# Patient Record
Sex: Female | Born: 1935 | Race: White | Hispanic: No
Health system: Midwestern US, Community
[De-identification: ages and names within clinical notes are randomized; demographics above are authoritative.]

## PROBLEM LIST (undated history)

## (undated) DIAGNOSIS — N83209 Unspecified ovarian cyst, unspecified side: Secondary | ICD-10-CM

## (undated) DIAGNOSIS — W19XXXA Unspecified fall, initial encounter: Secondary | ICD-10-CM

## (undated) DIAGNOSIS — F32A Depression, unspecified: Secondary | ICD-10-CM

## (undated) DIAGNOSIS — F329 Major depressive disorder, single episode, unspecified: Secondary | ICD-10-CM

## (undated) DIAGNOSIS — F0390 Unspecified dementia without behavioral disturbance: Secondary | ICD-10-CM

## (undated) DIAGNOSIS — F419 Anxiety disorder, unspecified: Secondary | ICD-10-CM

## (undated) HISTORY — DX: Major depressive disorder, single episode, unspecified: F32.9

## (undated) HISTORY — DX: Anxiety disorder, unspecified: F41.9

## (undated) HISTORY — DX: Depression, unspecified: F32.A

---
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
## 2014-01-17 NOTE — Progress Notes (Signed)
Chief Complaint   Patient presents with   ??? Ovarian Cyst     Visit Vitals   Item Reading   ??? BP 160/86   ??? Pulse 93   ??? Temp(Src) 98.2 ??F (36.8 ??C) (Oral)   ??? Resp 12   ??? Ht 5\' 3"  (1.6 m)   ??? Wt 132 lb 12.8 oz (60.238 kg)   ??? BMI 23.53 kg/m2     Patient states she wants a second opinion for the results from a lab test.  Patient states growth on ovary has increased in size.

## 2014-01-17 NOTE — Addendum Note (Signed)
Addended by: Therese SarahMILLER, Kynedi Profitt A on: 01/17/2014 12:29 PM     Modules accepted: Orders

## 2014-01-17 NOTE — Progress Notes (Signed)
HPI  Sierra Walker is a 80 y.o. female who presents to discuss ultrasound results. She had a right lower quadrant ultrasound and 1/15 that showed a simple cyst 2.1x2.8x4.1cm on her right ovary. It had increased in size from a previous ultrasound" years ago". She is experiencing some intermittent right lower quadrant pain. She presents to discuss her options. She is concerned about cancer and the fact she is having the occasional RLQ pain.    Ultrasound was done at [facility].    She is currently being treated for a E. coli UTI that is resistant to cephalosporins and Macrobid.    PMHx:  No past medical history on file.    Meds:   Current Outpatient Prescriptions   Medication Sig Dispense Refill   ??? solifenacin (VESICARE) 5 mg tablet Take 5 mg by mouth daily.       ??? mometasone (NASONEX) 50 mcg/actuation nasal spray 2 Sprays daily.       ??? PSEUDOEPHEDRINE-guaiFENesin (MUCINEX D) 60-600 mg per tablet Take 1 Tab by mouth every twelve (12) hours.           Allergies:   Allergies   Allergen Reactions   ??? Sulfa (Sulfonamide Antibiotics) Rash       Smoker:  History   Smoking status   ??? Not on file   Smokeless tobacco   ??? Not on file       ETOH:   History   Alcohol Use: Not on file       FH: No family history on file.    ROS:  As listed in HPI. In addition:  Constitutional:   No headache, fever, fatigue, weight loss or weight gain      Eyes:   No redness, pruritis, pain, visual changes, swelling, or discharge      Ears:    No pain, loss or changes in hearing     Cardiac:    No chest pain      Resp:   No cough or shortness of breath      Neuro   No loss of consciousness, dizziness, seizures      Physical Exam:  BP 160/86    Pulse 93    Temp(Src) 98.2 ??F (36.8 ??C) (Oral)    Resp 12    Ht 5\' 3"  (1.6 m)    Wt 132 lb 12.8 oz (60.238 kg)    BMI 23.53 kg/m2     GEN: No apparent distress. Alert and oriented and responds to all questions appropriately.           LUNGS: Respirations unlabored; clear to auscultation  bilaterally  CARDIOVASCULAR: Regular, rate, and rhythm without murmurs, gallops or rubs   ABDOMEN: Soft; nontender; nondistended; normoactive bowel sounds  NEUROLOGIC:  No focal neurologic deficits. Strength and sensation grossly intact.  Coordination and gait grossly intact.        Assessment and Plan     Right ovarian mass a postmenopausal woman  Discussed options including watchfull waiting   patient would prefer surgery. Will refer her to gyn/onc      ICD-9-CM   1. Right ovarian cyst 620.2     Attending note:      Patient interviewed and examined.  Discussion on ovarian pathology, reviewed possible treatments, recommended referral to GynOnc for evaluation and management  Resident note reviewed and agree without amendment.    Sierra Walker was seen today for ovarian cyst.    Diagnoses and associated orders for this visit:  Right ovarian cyst  - OVARIAN MALIGNANCY RISK-ROMA        Follow-up Disposition:  Return after referral.            Bing Neighbors. 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

## 2014-01-19 LAB — PREMENOPAUSAL INTERP: HIGH

## 2014-01-19 LAB — OVARIAN MALIGNANCY RISK-ROMA
CANCER ANTIGEN 125 (CA125): 12.5 U/mL (ref 0.0–35.0)
HE4: 88 pmol/L (ref 0–150)
POSTMENOPAUSAL ROMA: 1.7
PREMENOPAUSAL ROMA: 2.34 — ABNORMAL HIGH

## 2014-01-19 LAB — POSTMENOPAUSAL INTERP: LOW

## 2014-01-22 NOTE — Progress Notes (Signed)
[facility]  [address], [address]  [place], [place] [address]  [phone] FAX [phone]  Kathe Marinerharles M. Jones III, MD   Annia FriendlyJohnny Hyde, MD  Office Note  Patient ID:  Name:  Sierra Walker  MRN:  [id number]  DOB:  01-Nov-1927/80 y.o.  Date:  01/22/2014      HISTORY OF PRESENT ILLNESS:  Sierra Walker is a 80 y.o. Caucasian postmenopausal female who is being seen for a postmenopausal cyst on her right ovary.  It appears to have grown over a period of time, but appears simple.  Her ROMA is negative.  The cyst measures 4.1 x 2.8 x 2.1.  There is nor free fluid.  She does report some occasional pain in there RLQ.  She is quite anxious about the mass and wishes to have it surgically removed.  She had a prior vaginal hysterectomy for benign indications.      ROS:  GU and GI review: Negative  Cardiopulmonary review:Negative   Musculoskeletal:  Negative    A comprehensive review of systems was negative except for that written in the History of Present Illness. , 10 point ROS    OB/GYN ROS:  There is no history of significant gyn problems or procedures except that mentioned above.  Patient denies any abnormal bleeding or vaginal discharge.         Problem List:  Patient Active Problem List    Diagnosis Date Noted   ??? Right ovarian cyst 01/17/2014     PMH:  History reviewed. No pertinent past medical history.   PSH:  Past Surgical History   Procedure Laterality Date   ??? Hx gyn       ovaries removed age 80   ??? Hx colonoscopy     ??? Hx gi       e coli      Social History:  History   Substance Use Topics   ??? Smoking status: Never Smoker    ??? Smokeless tobacco: Never Used   ??? Alcohol Use: No      Family History:  Family History   Problem Relation Age of Onset   ??? No Known Problems Mother    ??? No Known Problems Father    ??? Cancer Brother      melanoma      Medications: (reviewed)  Current Outpatient Prescriptions   Medication Sig   ??? solifenacin (VESICARE) 5 mg tablet Take 5 mg by mouth daily.   ??? mometasone (NASONEX)  50 mcg/actuation nasal spray 2 Sprays daily.   ??? PSEUDOEPHEDRINE-guaiFENesin (MUCINEX D) 60-600 mg per tablet Take 1 Tab by mouth every twelve (12) hours.   ??? ciprofloxacin HCl (CIPRO) 500 mg tablet Take 200 mg by mouth two (2) times a day.   ??? levofloxacin (LEVAQUIN) 500 mg tablet    ??? tetracycline (ACHROMYCIN, SUMYCIN) 500 mg capsule      No current facility-administered medications for this visit.     Allergies: (reviewed)  Allergies   Allergen Reactions   ??? Sulfa (Sulfonamide Antibiotics) Rash          OBJECTIVE:    Physical Exam:  VITAL SIGNS: Filed Vitals:    01/22/14 1353   BP: 156/82   Pulse: 89   Height: 5\' 3"  (1.6 m)   Weight: 133 lb 3.2 oz (60.419 kg)     Body mass index is 23.6 kg/(m^2).   GENERAL APP: Conversant, alert, oriented. No acute distress.   HEENT: HEENT. No thyroid enlargement.  No JVD.   Neck: Supple without restrictions.   RESPIRATORY: Clear to auscultation and percussion to the bases. No CVAT.   CARDIOVASC: RRR without murmur/rub.   GASTROINT: soft, non-tender, without masses or organomegaly   MUSCULOSKEL: no joint tenderness, deformity or swelling   EXTREMITIES: extremities normal, atraumatic, no cyanosis or edema   PELVIC: External genitalia: normal general appearance  Urinary system: urethral meatus normal  Vaginal: normal without tenderness, induration or masses  Cervix: absent  Adnexa: non palpable  Uterus: absent   RECTAL: Deferred   NODAL SURVEY: No suspicious lymphadenopathy or edema noted.   NEURO: Grossly intact. No acute deficit.       Imaging:  Outside pelvic ultrasound results reviewed.      IMPRESSION/PLAN:  Sierra Walker is a 80 y.o. female with a working diagnosis of postmenopausal cystic mass.  I reviewed with Sierra Walker her medical records, physical exam, and review of symptoms.  I reassured her that it was most likely a benign mass, but I understand her concern and agree to proceed with laparoscopic BSO.  She is counseled on the risks, benefits, indications, and  alternatives of surgery.  Her questions were answered and she wishes to proceed as planned.      Signed By: Domenica FailJohnny Hyde Jr., MD     01/22/2014/1:58 PM

## 2014-01-22 NOTE — Progress Notes (Signed)
Quick Note:    No additional comment  ______

## 2014-02-08 LAB — TYPE AND SCREEN
ABO/Rh: A POS
Antibody Screen: NEGATIVE

## 2014-02-08 LAB — EKG, 12 LEAD, INITIAL
Atrial Rate: 75 {beats}/min
Calculated P Axis: 51 degrees
Calculated R Axis: -39 degrees
Calculated T Axis: 28 degrees
P-R Interval: 162 ms
Q-T Interval: 398 ms
QRS Duration: 76 ms
QTC Calculation (Bezet): 444 ms
Ventricular Rate: 75 {beats}/min

## 2014-02-08 LAB — TYPE & SCREEN
ABO/Rh(D): A POS
Antibody screen: NEGATIVE

## 2014-02-08 NOTE — Other (Addendum)
PATIENT GIVEN SURGICAL SITE INFECTION FAQS INFORMATION HANDOUT.  PT HAS BEEN GIVEN THE OPPORTUNITY TO ASK ADDITIONAL QUESTIONS.    PATIENT DENIES TRAVEL TO [place] OR ANY AREA WHERE EVD TRANSMISSION HAS BEEN REPORTED BY WHO; AND DENIES CLOSE CONTACT WITH ANYONE TRAVELING TO THOSE AREAS IN THE PAST 21 DAYS.    DR. Berenice PrimasHYDE'S OFFICE AWARE THAT PT MAY NOT HAVE ANYONE TO STAY WITH HER FIRST 24 HRS AFTER SURGERY; SHE MAY NEED TO BE A 23HR OBSERVATION.  PT WILL WORK ON FINDING SOMEONE TO STAY WITH HER, BUT WILL LET DR. HYDE KNOW DOS.    PT GIVEN DIRECTIONS TO HAVE CXR COMPLETED

## 2014-02-09 NOTE — Other (Signed)
FOLLOW UP:    ALL PAT TEST RESULTS FAXED TO DR. Berenice PrimasHYDE'S OFFICE; [facility].  DR. Lendell CapriceBRANDON-ANESTHESIA OK'D EKG FOR SURGERY.

## 2014-02-12 ENCOUNTER — Inpatient Hospital Stay: Payer: MEDICARE

## 2014-02-12 MED ORDER — MIDAZOLAM 1 MG/ML IJ SOLN
1 mg/mL | INTRAMUSCULAR | Status: DC | PRN
Start: 2014-02-12 — End: 2014-02-12

## 2014-02-12 MED ORDER — ROCURONIUM 10 MG/ML IV
10 mg/mL | INTRAVENOUS | Status: DC | PRN
Start: 2014-02-12 — End: 2014-02-12
  Administered 2014-02-12: 12:00:00 via INTRAVENOUS

## 2014-02-12 MED ORDER — PROPOFOL 10 MG/ML IV EMUL
10 mg/mL | INTRAVENOUS | Status: DC | PRN
Start: 2014-02-12 — End: 2014-02-12
  Administered 2014-02-12: 12:00:00 via INTRAVENOUS

## 2014-02-12 MED ORDER — SODIUM CHLORIDE 0.9 % IJ SYRG
INTRAMUSCULAR | Status: DC | PRN
Start: 2014-02-12 — End: 2014-02-12

## 2014-02-12 MED ORDER — FENTANYL CITRATE (PF) 50 MCG/ML IJ SOLN
50 mcg/mL | INTRAMUSCULAR | Status: AC
Start: 2014-02-12 — End: ?

## 2014-02-12 MED ORDER — CEFOXITIN 2 GRAM IV SOLUTION
2 gram | INTRAVENOUS | Status: DC | PRN
Start: 2014-02-12 — End: 2014-02-12
  Administered 2014-02-12: 12:00:00 via INTRAVENOUS

## 2014-02-12 MED ORDER — LIDOCAINE (PF) 20 MG/ML (2 %) IJ SOLN
20 mg/mL (2 %) | INTRAMUSCULAR | Status: DC | PRN
Start: 2014-02-12 — End: 2014-02-12
  Administered 2014-02-12: 12:00:00 via INTRAVENOUS

## 2014-02-12 MED ORDER — FENTANYL CITRATE (PF) 50 MCG/ML IJ SOLN
50 mcg/mL | INTRAMUSCULAR | Status: DC | PRN
Start: 2014-02-12 — End: 2014-02-12
  Administered 2014-02-12 (×3): via INTRAVENOUS

## 2014-02-12 MED ORDER — DEXAMETHASONE SODIUM PHOSPHATE 4 MG/ML IJ SOLN
4 mg/mL | INTRAMUSCULAR | Status: AC
Start: 2014-02-12 — End: ?

## 2014-02-12 MED ORDER — DEXAMETHASONE SODIUM PHOSPHATE 4 MG/ML IJ SOLN
4 mg/mL | Freq: Once | INTRAMUSCULAR | Status: DC | PRN
Start: 2014-02-12 — End: 2014-02-12

## 2014-02-12 MED ORDER — SUCCINYLCHOLINE CHLORIDE 20 MG/ML INJECTION
20 mg/mL | INTRAMUSCULAR | Status: DC | PRN
Start: 2014-02-12 — End: 2014-02-12
  Administered 2014-02-12: 12:00:00 via INTRAVENOUS

## 2014-02-12 MED ORDER — FENTANYL CITRATE (PF) 50 MCG/ML IJ SOLN
50 mcg/mL | INTRAMUSCULAR | Status: DC | PRN
Start: 2014-02-12 — End: 2014-02-12
  Administered 2014-02-12: 12:00:00 via INTRAVENOUS

## 2014-02-12 MED ORDER — MORPHINE 10 MG/ML INJ SOLUTION
10 mg/ml | INTRAMUSCULAR | Status: DC | PRN
Start: 2014-02-12 — End: 2014-02-12

## 2014-02-12 MED ORDER — DIPHENHYDRAMINE HCL 50 MG/ML IJ SOLN
50 mg/mL | INTRAMUSCULAR | Status: DC | PRN
Start: 2014-02-12 — End: 2014-02-12

## 2014-02-12 MED ORDER — OXYCODONE-ACETAMINOPHEN 5 MG-325 MG TAB
5-325 mg | ORAL | Status: DC | PRN
Start: 2014-02-12 — End: 2014-02-12
  Administered 2014-02-12: 15:00:00 via ORAL

## 2014-02-12 MED ORDER — HYDROMORPHONE (PF) 2 MG/ML IJ SOLN
2 mg/mL | INTRAMUSCULAR | Status: AC
Start: 2014-02-12 — End: ?

## 2014-02-12 MED ORDER — ONDANSETRON (PF) 4 MG/2 ML INJECTION
4 mg/2 mL | INTRAMUSCULAR | Status: DC | PRN
Start: 2014-02-12 — End: 2014-02-12
  Administered 2014-02-12: 13:00:00 via INTRAVENOUS

## 2014-02-12 MED ORDER — LACTATED RINGERS IV
INTRAVENOUS | Status: DC
Start: 2014-02-12 — End: 2014-02-12

## 2014-02-12 MED ORDER — LACTATED RINGERS IV
INTRAVENOUS | Status: DC
Start: 2014-02-12 — End: 2014-02-12
  Administered 2014-02-12: 11:00:00 via INTRAVENOUS

## 2014-02-12 MED ORDER — LIDOCAINE (PF) 10 MG/ML (1 %) IJ SOLN
10 mg/mL (1 %) | INTRAMUSCULAR | Status: DC | PRN
Start: 2014-02-12 — End: 2014-02-12

## 2014-02-12 MED ORDER — HYDROMORPHONE (PF) 2 MG/ML IJ SOLN
2 mg/mL | INTRAMUSCULAR | Status: DC | PRN
Start: 2014-02-12 — End: 2014-02-12
  Administered 2014-02-12: 12:00:00 via INTRAVENOUS

## 2014-02-12 MED ORDER — ONDANSETRON (PF) 4 MG/2 ML INJECTION
4 mg/2 mL | INTRAMUSCULAR | Status: DC | PRN
Start: 2014-02-12 — End: 2014-02-12

## 2014-02-12 MED ORDER — ROPIVACAINE (PF) 5 MG/ML (0.5 %) INJECTION
5 mg/mL (0. %) | INTRAMUSCULAR | Status: DC
Start: 2014-02-12 — End: 2014-02-12

## 2014-02-12 MED ORDER — LACTATED RINGERS IV
INTRAVENOUS | Status: DC | PRN
Start: 2014-02-12 — End: 2014-02-12
  Administered 2014-02-12: 12:00:00 via INTRAVENOUS

## 2014-02-12 MED ORDER — ONDANSETRON (PF) 4 MG/2 ML INJECTION
4 mg/2 mL | INTRAMUSCULAR | Status: AC
Start: 2014-02-12 — End: ?

## 2014-02-12 MED ORDER — NEOSTIGMINE METHYLSULFATE 1 MG/ML INJECTION
1 mg/mL | INTRAMUSCULAR | Status: AC
Start: 2014-02-12 — End: ?

## 2014-02-12 MED ORDER — SODIUM CHLORIDE 0.9 % IV
INTRAVENOUS | Status: DC
Start: 2014-02-12 — End: 2014-02-12

## 2014-02-12 MED ORDER — HYDROMORPHONE (PF) 1 MG/ML IJ SOLN
1 mg/mL | INTRAMUSCULAR | Status: DC | PRN
Start: 2014-02-12 — End: 2014-02-12

## 2014-02-12 MED ORDER — LIDOCAINE (PF) 20 MG/ML (2 %) IJ SOLN
20 mg/mL (2 %) | INTRAMUSCULAR | Status: AC
Start: 2014-02-12 — End: ?

## 2014-02-12 MED ORDER — SODIUM CHLORIDE 0.9 % IJ SYRG
Freq: Three times a day (TID) | INTRAMUSCULAR | Status: DC
Start: 2014-02-12 — End: 2014-02-12

## 2014-02-12 MED ORDER — SODIUM CHLORIDE 0.9 % IV
1 gram | INTRAVENOUS | Status: DC
Start: 2014-02-12 — End: 2014-02-12

## 2014-02-12 MED ORDER — NEOSTIGMINE METHYLSULFATE 1 MG/ML INJECTION
1 mg/mL | INTRAMUSCULAR | Status: DC | PRN
Start: 2014-02-12 — End: 2014-02-12
  Administered 2014-02-12: 13:00:00 via INTRAVENOUS

## 2014-02-12 MED ORDER — DEXAMETHASONE SODIUM PHOSPHATE 4 MG/ML IJ SOLN
4 mg/mL | INTRAMUSCULAR | Status: DC | PRN
Start: 2014-02-12 — End: 2014-02-12
  Administered 2014-02-12: 12:00:00 via INTRAVENOUS

## 2014-02-12 MED ORDER — EPHEDRINE SULFATE 50 MG/ML IJ SOLN
50 mg/mL | INTRAMUSCULAR | Status: DC | PRN
Start: 2014-02-12 — End: 2014-02-12

## 2014-02-12 MED ORDER — GLYCOPYRROLATE 0.2 MG/ML IJ SOLN
0.2 mg/mL | INTRAMUSCULAR | Status: AC
Start: 2014-02-12 — End: ?

## 2014-02-12 MED ORDER — CEFOXITIN 2 GRAM IV SOLUTION
2 gram | INTRAVENOUS | Status: DC
Start: 2014-02-12 — End: 2014-02-12

## 2014-02-12 MED ORDER — MEPERIDINE (PF) 25 MG/ML INJ SOLUTION
25 mg/ml | Freq: Once | INTRAMUSCULAR | Status: DC
Start: 2014-02-12 — End: 2014-02-12

## 2014-02-12 MED ORDER — FENTANYL CITRATE (PF) 50 MCG/ML IJ SOLN
50 mcg/mL | INTRAMUSCULAR | Status: DC | PRN
Start: 2014-02-12 — End: 2014-02-12

## 2014-02-12 MED ORDER — MIDAZOLAM 1 MG/ML IJ SOLN
1 mg/mL | INTRAMUSCULAR | Status: DC | PRN
Start: 2014-02-12 — End: 2014-02-12
  Administered 2014-02-12: 12:00:00 via INTRAVENOUS

## 2014-02-12 MED ORDER — OXYCODONE-ACETAMINOPHEN 5 MG-325 MG TAB
5-325 mg | ORAL_TABLET | ORAL | Status: DC | PRN
Start: 2014-02-12 — End: 2014-03-01

## 2014-02-12 MED ORDER — PROPOFOL 10 MG/ML IV EMUL
10 mg/mL | INTRAVENOUS | Status: AC
Start: 2014-02-12 — End: ?

## 2014-02-12 MED ORDER — MIDAZOLAM 1 MG/ML IJ SOLN
1 mg/mL | INTRAMUSCULAR | Status: AC
Start: 2014-02-12 — End: ?

## 2014-02-12 MED ORDER — SUCCINYLCHOLINE CHLORIDE 140 MG/7 ML (20 MG/ML) SYR
140 mg/7 mL (20 mg/mL) | INTRAVENOUS | Status: AC
Start: 2014-02-12 — End: ?

## 2014-02-12 MED ORDER — GLYCOPYRROLATE 0.2 MG/ML IJ SOLN
0.2 mg/mL | INTRAMUSCULAR | Status: DC | PRN
Start: 2014-02-12 — End: 2014-02-12
  Administered 2014-02-12: 13:00:00 via INTRAVENOUS

## 2014-02-12 MED FILL — ONDANSETRON (PF) 4 MG/2 ML INJECTION: 4 mg/2 mL | INTRAMUSCULAR | Qty: 2

## 2014-02-12 MED FILL — SUCCINYLCHOLINE CHLORIDE 140 MG/7 ML (20 MG/ML) SYR: 140 mg/7 mL (20 mg/mL) | INTRAVENOUS | Qty: 7

## 2014-02-12 MED FILL — SODIUM CHLORIDE 0.9 % IV: INTRAVENOUS | Qty: 1000

## 2014-02-12 MED FILL — OXYCODONE-ACETAMINOPHEN 5 MG-325 MG TAB: 5-325 mg | ORAL | Qty: 1

## 2014-02-12 MED FILL — LACTATED RINGERS IV: INTRAVENOUS | Qty: 1000

## 2014-02-12 MED FILL — XYLOCAINE-MPF 20 MG/ML (2 %) INJECTION SOLUTION: 20 mg/mL (2 %) | INTRAMUSCULAR | Qty: 5

## 2014-02-12 MED FILL — GLYCOPYRROLATE 0.2 MG/ML IJ SOLN: 0.2 mg/mL | INTRAMUSCULAR | Qty: 2

## 2014-02-12 MED FILL — FENTANYL CITRATE (PF) 50 MCG/ML IJ SOLN: 50 mcg/mL | INTRAMUSCULAR | Qty: 2

## 2014-02-12 MED FILL — DIPRIVAN 10 MG/ML INTRAVENOUS EMULSION: 10 mg/mL | INTRAVENOUS | Qty: 20

## 2014-02-12 MED FILL — CEFOXITIN 2 GRAM IV SOLUTION: 2 gram | INTRAVENOUS | Qty: 2

## 2014-02-12 MED FILL — NEOSTIGMINE METHYLSULFATE 1 MG/ML INJECTION: 1 mg/mL | INTRAMUSCULAR | Qty: 10

## 2014-02-12 MED FILL — MIDAZOLAM 1 MG/ML IJ SOLN: 1 mg/mL | INTRAMUSCULAR | Qty: 2

## 2014-02-12 MED FILL — DILAUDID (PF) 2 MG/ML INJECTION SOLUTION: 2 mg/mL | INTRAMUSCULAR | Qty: 1

## 2014-02-12 MED FILL — DEXAMETHASONE SODIUM PHOSPHATE 4 MG/ML IJ SOLN: 4 mg/mL | INTRAMUSCULAR | Qty: 1

## 2014-02-12 NOTE — H&P (Signed)
[facility]  [address], [address]  [place], [place] [address]  [phone] FAX [phone]  Kathe Marinerharles M. Jones III, MD   Annia FriendlyJohnny Hyde, MD    Patient ID:  Name:  Sierra Walker  MRN:  [id number]  DOB:  05/20/1928/80 y.o.  Date:  02/12/2014      HISTORY OF PRESENT ILLNESS:  Sierra Walker is a 80 y.o. Caucasian postmenopausal female who is being seen for a postmenopausal cyst on her right ovary. It appears to have grown over a period of time, but appears simple. Her ROMA is negative. The cyst measures 4.1 x 2.8 x 2.1. There is nor free fluid. She does report some occasional pain in there RLQ. She is quite anxious about the mass and wishes to have it surgically removed. She had a prior vaginal hysterectomy for benign indications.      ROS:  GU and GI review: Negative  Cardiopulmonary review:Negative   Musculoskeletal: Negative    A comprehensive review of systems was negative except for that written in the History of Present Illness. , 10 point ROS    OB/GYN ROS:  There is no history of significant gyn problems or procedures except that mentioned above.  Patient denies any abnormal bleeding or vaginal discharge.       Problem List:  Patient Active Problem List    Diagnosis Date Noted   ??? Right ovarian cyst 01/17/2014     PMH:  Past Medical History   Diagnosis Date   ??? Thromboembolus (HCC)      LEFT GROIN   ??? GERD (gastroesophageal reflux disease)      IN PAST   ??? Diabetes (HCC)      PRE   ??? Arthritis    ??? Cancer (HCC)      SKIN CANCER ON BOTTOM OF FOOT   ??? Incontinence of urine       PSH:  Past Surgical History   Procedure Laterality Date   ??? Hx colonoscopy     ??? Hx other surgical       LEFT FOOT SKIN CANCER REMOVED   ??? Hx orthopaedic       TRIGGER FINGER AND CARPAL TUNNEL RIGHT   ??? Hx heent       CATARACTS BILATERAL   ??? Hx heent       EYE LID SURGERY   ??? Hx gi       e coli   ??? Hx gi       GERD SURGERY   ??? Hx hysterectomy       AGE 27   ??? Hx tonsil and adenoidectomy       AS CHILD   ??? Hx appendectomy   2007      Social History:  History   Substance Use Topics   ??? Smoking status: Never Smoker    ??? Smokeless tobacco: Never Used   ??? Alcohol Use: Yes      Comment: OCCAS      Family History:  Family History   Problem Relation Age of Onset   ??? Other Mother      SPINAL STENOSIS   ??? Cancer Brother      melanoma   ??? Diabetes Brother    ??? Anesth Problems Neg Hx    ??? Heart Attack Son    ??? Crohn's Disease Daughter       Medications: (reviewed)  Current Facility-Administered Medications   Medication Dose Route Frequency   ??? lactated ringers  infusion  125 mL/hr IntraVENous CONTINUOUS   ??? 0.9% sodium chloride infusion  25 mL/hr IntraVENous CONTINUOUS   ??? sodium chloride (NS) flush 5-10 mL  5-10 mL IntraVENous Q8H   ??? sodium chloride (NS) flush 5-10 mL  5-10 mL IntraVENous PRN   ??? lidocaine (PF) (XYLOCAINE) 10 mg/mL (1 %) injection 0.1 mL  0.1 mL SubCUTAneous PRN   ??? fentaNYL citrate (PF) injection 50 mcg  50 mcg IntraVENous PRN   ??? midazolam (VERSED) injection 1 mg  1 mg IntraVENous PRN   ??? midazolam (VERSED) injection 1 mg  1 mg IntraVENous PRN   ??? ropivacaine (PF) (NAROPIN) 5 mg/mL (0.5 %) injection 150 mg  30 mL Peripheral Nerve Block NOW   ??? cefOXitin (MEFOXIN) 2 g in 0.9% sodium chloride (MBP/ADV) 100 mL  2 g IntraVENous ON CALL TO OR     Allergies: (reviewed)  Allergies   Allergen Reactions   ??? Sulfa (Sulfonamide Antibiotics) Rash          OBJECTIVE:    Physical Exam:  VITAL SIGNS: There were no vitals filed for this visit.  There is no weight on file to calculate BMI.   GENERAL APP: Conversant, alert, oriented. No acute distress.   HEENT: HEENT. No thyroid enlargement. No JVD.   Neck: Supple without restrictions.   RESPIRATORY: Clear to auscultation and percussion to the bases. No CVAT.   CARDIOVASC: RRR without murmur/rub.   GASTROINT: soft, non-tender, without masses or organomegaly   MUSCULOSKEL: no joint tenderness, deformity or swelling   EXTREMITIES: extremities normal, atraumatic, no cyanosis or edema   PELVIC:  External genitalia: normal general appearance  Urinary system: urethral meatus normal  Vaginal: normal without tenderness, induration or masses  Cervix: absent  Adnexa: non palpable  Uterus: absent   RECTAL: Deferred   NODAL SURVEY: No suspicious lymphadenopathy or edema noted.   NEURO: Grossly intact. No acute deficit.       Imaging:  Outside pelvic ultrasound results reviewed.      IMPRESSION/PLAN:  Sierra Walker is a 80 y.o. female with a working diagnosis of postmenopausal cystic mass. I reviewed with Sierra Walker her medical records, physical exam, and review of symptoms. I reassured her that it was most likely a benign mass, but I understand her concern and agree to proceed with laparoscopic BSO. She is counseled on the risks, benefits, indications, and alternatives of surgery. Her questions were answered and she wishes to proceed as planned.      Signed By: Domenica FailJohnny Hyde Jr., MD     02/12/2014/7:03 AM

## 2014-02-12 NOTE — Brief Op Note (Signed)
BRIEF OPERATIVE NOTE    Date of Procedure: 02/12/2014   Preoperative Diagnosis: RIGHT OVARIAN CYST  Postoperative Diagnosis: RIGHT HYDROSALPINX    Procedure(s):  LAPAROSCOPIC BILATERAL SALPINGO-OOPHORECTOMY   Surgeon(s) and Role:     * Landy Mace Renea EeHyde Jr., MD - Primary  Anesthesia: General   Estimated Blood Loss: 10 cc  Specimens:   ID Type Source Tests Collected by Time Destination   1 : left fallopian tube, ovary Fresh Ovary  Domenica FailJohnny Hyde Jr., MD 02/12/2014 [phone] Pathology   2 : right fallopian tube, ovary Fresh Ovary  Domenica FailJohnny Hyde Jr., MD 02/12/2014 0830 Pathology   1 : pelvic washings Fresh Other                  Domenica FailJohnny Hyde Jr., MD 02/12/2014 [phone] Cytology      Findings: Right hydrosalpinx.  Bilateral ovaries adherent to the pelvic sidewall and vaginal cuff s/p vaginal hysterectomy.   Complications: None    Domenica FailJohnny Hyde Jr., MD

## 2014-02-12 NOTE — Op Note (Signed)
Gynecologic Oncology Operative Report    Sierra Walker  02/12/2014    Pre-operative dx:  Right ovarian cyst, pelvic mass    Post-operative dx:  Right hydrosalpinx    Procedure:  Bilateral salpingo-oophorectomy    Surgeon:  Jernie Schutt Hyde, MD    Assistant:  Sean Huiras, PA-C     Anesthesia:  GETA    EBL:  10 cc    Complications:  None    Specimens:  bilateral fallopian tubes and ovaries, pelvic washings    Operative indications:  80 yo WF with a cystic mass of the right ovary.  Her ROMA test was negative, but she was very anxious about possible malignancy.  I recommended laparoscopic evaluation with resection.     Operative findings:  Right hydrosalpinx. Bilateral ovaries adherent to the pelvic sidewall and vaginal cuff s/p vaginal hysterectomy.      Procedure in detail:  After the risks, benefits, indications, and alternatives of the procedure were discussed with the patient and informed consent was obtained, the patient was taken to the operating room.  She was positively identified, administered general anesthesia, and then placed in the dorsal lithotomy position in Allen stirrups.  An exam under anesthesia was performed.  She was then prepped and draped in the usual fashion.  A foley catheter was inserted, followed by a sponge-stick in the vagina to help delineate the vaginal cuff.  We then proceeded with laparoscopy by grasping the umbilicus on either side with Allis clamps.  A small incision was made at the base with an #11-blade scalpel.  A 5 mm blade-less trocar was then directly inserted, with the camera in position to visualize safe entry.  Once proper placement was confirmed, the abdomen was then insufflated with carbon dioxide.  A 12 mm blade-less trocar was inserted in the left lower quadrant and a 5 mm blade-less trocar was then inserted in the right lower quadrant, each midway between the anterior superior iliac spine and the umbilicus.  These trocars were inserted under direct visualization to ensure safe  entry.  The abdomen and pelvis were then examined, with the above mentioned findings noted.  Pelvic washings were obtained as well.  The patient was then placed in Trendelenburg tilt and we then proceeded with the resection.      There were some adhesions of the recto sigmoid colon to the left adnexa.  These were taken down sharply with the Harmonic Scalpel.  We then opened the retroperitoneum on the left side of the pelvis by incising the peritoneum with the Harmonic Scalpel.  This allowed for visualization of the ureter.  The ovarian vessels were then isolated and then coagulated and transected with the Harmonic Scalpel.  The remaining peritoneal attachments were then transected, freeing the adnexa.  It was placed in an Endocatch bag and delivered through the 12 mm trocar site.  We the directed our attention to the right side.  We then opened the retroperitoneum on the right side of the pelvis by incising the peritoneum with the Harmonic Scalpel.  This allowed for visualization of the ureter.  The ovarian vessels were then isolated and then coagulated and transected with the Harmonic Scalpel.  The remaining peritoneal attachments were then transected, freeing the adnexa.  It was placed in an Endocatch bag and delivered through the 12 mm trocar site.  The 12 mm site was then closed at the fascial level with a 0 Vicryl suture using the Carter-Thomason closure device.      The pelvis was   then irrigated and all pedicles inspected.  Once satisfied with hemostasis, the gas was then allowed to escape from the abdomen and the trocars were removed.  She was then taken out of Trendelenburg tilt.  The incision sites were closed with a stitch of 4-0 Monocryl, followed by a layer of Dermabond.  The sponge-stick was then removed from the vagina.  The patient was taken out of stirrups, awakened from anesthesia, and taken to the recovery room in stable condition.  All instrument, sponge, and needle counts were correct.         Sierra FailJohnny Hyde Jr., MD  02/12/2014  5:14 PM

## 2014-02-12 NOTE — Anesthesia Pre-Procedure Evaluation (Signed)
Anesthetic History   No history of anesthetic complications           Review of Systems / Medical History  Patient summary reviewed, nursing notes reviewed and pertinent labs reviewed    Pulmonary  Within defined limits               Neuro/Psych   Within defined limits           Cardiovascular  Within defined limits                   GI/Hepatic/Renal  Within defined limits                Endo/Other  Within defined limits  Diabetes: well controlled    Arthritis     Other Findings            Physical Exam    Airway  Mallampati: II  TM Distance: > 6 cm  Neck ROM: normal range of motion   Mouth opening: Normal     Cardiovascular  Regular rate and rhythm,  S1 and S2 normal,  no murmur, click, rub, or gallop             Dental  No notable dental hx       Pulmonary  Breath sounds clear to auscultation               Abdominal  GI exam deferred       Other Findings            Anesthetic Plan    ASA: 2  Anesthesia type: general          Induction: Intravenous  Anesthetic plan and risks discussed with: Patient

## 2014-02-12 NOTE — Other (Signed)
Patient: Sierra Walker MRN: [id number]  SSN: [id number]   Date of Birth: 1928/02/23  Age: 80 y.o.  Sex: female     Patient is status post Procedure(s):  LAPAROSCOPIC BILATERAL SALPINGO-OOPHORECTOMY .    Surgeon(s) and Role:     * Johnny Renea EeHyde Jr., MD - Primary                      Peripheral IV 02/12/14 Right Wrist (Active)   Site Assessment Clean, dry, & intact 02/12/2014  7:00 AM   Phlebitis Assessment 0 02/12/2014  7:00 AM   Infiltration Assessment 0 02/12/2014  7:00 AM   Dressing Status Occlusive 02/12/2014  7:00 AM   Dressing Type Transparent 02/12/2014  7:00 AM   Hub Color/Line Status Green;Flushed 02/12/2014  7:00 AM            Airway - Endotracheal Tube 02/12/14 Oral (Active)   Insertion Depth (cm) 20 cm 02/12/2014 12:00 AM   Line Mark Teeth 02/12/2014 12:00 AM                   Dressing/Packing:  Wound Abdomen Anterior-DRESSING TYPE: Topical skin adhesive/glue (02/12/14 0700)  Splint/Cast:  ]

## 2014-02-12 NOTE — Other (Signed)
Patient interviewed in holding area, identity and procedure verified.1000 ml 0.9% NaCl as irrigation.

## 2014-02-12 NOTE — Other (Signed)
1230 Pt stable, and discharged home with son.

## 2014-02-12 NOTE — Anesthesia Post-Procedure Evaluation (Signed)
Post-Anesthesia Evaluation and Assessment    Patient: Sierra Walker MRN: [id number]  SSN: [id number]    Date of Birth: 04/20/1928  Age: 80 y.o.  Sex: female       Cardiovascular Function/Vital Signs  Visit Vitals   Item Reading   ??? BP 150/71   ??? Pulse 76   ??? Temp 36.4 ??C (97.6 ??F)   ??? Resp 23   ??? Ht 5\' 2"  (1.575 m)   ??? Wt 60.328 kg (133 lb)   ??? BMI 24.32 kg/m2   ??? SpO2 94%       Patient is status post general anesthesia for Procedure(s):  LAPAROSCOPIC BILATERAL SALPINGO-OOPHORECTOMY .    Nausea/Vomiting: None    Postoperative hydration reviewed and adequate.    Pain:  Pain Scale 1: Numeric (0 - 10) (02/12/14 1236)  Pain Intensity 1: 4 (02/12/14 1236)   Managed    Neurological Status:   Neuro (WDL): Within Defined Limits (02/12/14 1018)  Neuro  LUE Motor Response: Purposeful (02/12/14 1018)  LLE Motor Response: Purposeful (02/12/14 1018)  RUE Motor Response: Purposeful (02/12/14 1018)  RLE Motor Response: Purposeful (02/12/14 1018)   At baseline    Mental Status and Level of Consciousness: Alert and oriented     Pulmonary Status:   O2 Device: Room air (02/12/14 1236)   Adequate oxygenation and airway patent    Complications related to anesthesia: None    Post-anesthesia assessment completed. No concerns    Signed By: Dalia Headingevang M Chabely Norby, MD     Feb 12, 2014

## 2014-02-12 NOTE — Op Note (Signed)
Gynecologic Oncology Operative Report    Primitivo Gauzevelyn Zwilling  02/12/2014    Pre-operative dx:  Right ovarian cyst, pelvic mass    Post-operative dx:  Right hydrosalpinx    Procedure:  Bilateral salpingo-oophorectomy    Surgeon:  Annia FriendlyJohnny Hyde, MD    Assistant:  Lavella LemonsSean Huiras, PA-C     Anesthesia:  GETA    EBL:  10 cc    Complications:  None    Specimens:  bilateral fallopian tubes and ovaries, pelvic washings    Operative indications:  80 yo WF with a cystic mass of the right ovary.  Her ROMA test was negative, but she was very anxious about possible malignancy.  I recommended laparoscopic evaluation with resection.     Operative findings:  Right hydrosalpinx. Bilateral ovaries adherent to the pelvic sidewall and vaginal cuff s/p vaginal hysterectomy.      Procedure in detail:  After the risks, benefits, indications, and alternatives of the procedure were discussed with the patient and informed consent was obtained, the patient was taken to the operating room.  She was positively identified, administered general anesthesia, and then placed in the dorsal lithotomy position in [place] stirrups.  An exam under anesthesia was performed.  She was then prepped and draped in the usual fashion.  A foley catheter was inserted, followed by a sponge-stick in the vagina to help delineate the vaginal cuff.  We then proceeded with laparoscopy by grasping the umbilicus on either side with Allis clamps.  A small incision was made at the base with an #11-blade scalpel.  A 5 mm blade-less trocar was then directly inserted, with the camera in position to visualize safe entry.  Once proper placement was confirmed, the abdomen was then insufflated with carbon dioxide.  A 12 mm blade-less trocar was inserted in the left lower quadrant and a 5 mm blade-less trocar was then inserted in the right lower quadrant, each midway between the anterior superior iliac spine and the umbilicus.  These trocars were inserted under direct visualization to ensure safe  entry.  The abdomen and pelvis were then examined, with the above mentioned findings noted.  Pelvic washings were obtained as well.  The patient was then placed in Trendelenburg tilt and we then proceeded with the resection.      There were some adhesions of the recto sigmoid colon to the left adnexa.  These were taken down sharply with the Harmonic Scalpel.  We then opened the retroperitoneum on the left side of the pelvis by incising the peritoneum with the Harmonic Scalpel.  This allowed for visualization of the ureter.  The ovarian vessels were then isolated and then coagulated and transected with the Harmonic Scalpel.  The remaining peritoneal attachments were then transected, freeing the adnexa.  It was placed in an Endocatch bag and delivered through the 12 mm trocar site.  We the directed our attention to the right side.  We then opened the retroperitoneum on the right side of the pelvis by incising the peritoneum with the Harmonic Scalpel.  This allowed for visualization of the ureter.  The ovarian vessels were then isolated and then coagulated and transected with the Harmonic Scalpel.  The remaining peritoneal attachments were then transected, freeing the adnexa.  It was placed in an Endocatch bag and delivered through the 12 mm trocar site.  The 12 mm site was then closed at the fascial level with a 0 Vicryl suture using the Carter-Thomason closure device.      The pelvis was  then irrigated and all pedicles inspected.  Once satisfied with hemostasis, the gas was then allowed to escape from the abdomen and the trocars were removed.  She was then taken out of Trendelenburg tilt.  The incision sites were closed with a stitch of 4-0 Monocryl, followed by a layer of Dermabond.  The sponge-stick was then removed from the vagina.  The patient was taken out of stirrups, awakened from anesthesia, and taken to the recovery room in stable condition.  All instrument, sponge, and needle counts were correct.         Domenica FailJohnny Hyde Jr., MD  02/12/2014  5:14 PM

## 2014-02-13 MED FILL — ROCURONIUM 10 MG/ML IV: 10 mg/mL | INTRAVENOUS | Qty: 2

## 2014-02-13 MED FILL — GLYCOPYRROLATE 0.2 MG/ML IJ SOLN: 0.2 mg/mL | INTRAMUSCULAR | Qty: 0.3

## 2014-02-13 MED FILL — DIPRIVAN 10 MG/ML INTRAVENOUS EMULSION: 10 mg/mL | INTRAVENOUS | Qty: 12

## 2014-02-13 MED FILL — ONDANSETRON (PF) 4 MG/2 ML INJECTION: 4 mg/2 mL | INTRAMUSCULAR | Qty: 2

## 2014-02-13 MED FILL — NEOSTIGMINE METHYLSULFATE 1 MG/ML INJECTION: 1 mg/mL | INTRAMUSCULAR | Qty: 1.5

## 2014-02-13 MED FILL — XYLOCAINE-MPF 20 MG/ML (2 %) INJECTION SOLUTION: 20 mg/mL (2 %) | INTRAMUSCULAR | Qty: 25

## 2014-02-13 MED FILL — DEXAMETHASONE SODIUM PHOSPHATE 4 MG/ML IJ SOLN: 4 mg/mL | INTRAMUSCULAR | Qty: 1

## 2014-02-13 MED FILL — QUELICIN 20 MG/ML INJECTION SOLUTION: 20 mg/mL | INTRAMUSCULAR | Qty: 5

## 2014-02-13 MED FILL — CEFOXITIN 2 GRAM IV SOLUTION: 2 gram | INTRAVENOUS | Qty: 2

## 2014-03-01 MED ORDER — CIPROFLOXACIN 500 MG TAB
500 mg | ORAL_TABLET | Freq: Two times a day (BID) | ORAL | Status: AC
Start: 2014-03-01 — End: ?
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
## 2014-03-01 NOTE — Progress Notes (Signed)
Pt no longer taking percocet

## 2018-02-10 ENCOUNTER — Encounter: Payer: Self-pay | Admitting: Neurology

## 2018-05-03 ENCOUNTER — Other Ambulatory Visit: Payer: Self-pay

## 2018-05-03 ENCOUNTER — Ambulatory Visit (INDEPENDENT_AMBULATORY_CARE_PROVIDER_SITE_OTHER): Payer: Medicare Other | Admitting: Neurology

## 2018-05-03 ENCOUNTER — Encounter: Payer: Self-pay | Admitting: Neurology

## 2018-05-03 VITALS — BP 124/52 | HR 75 | Wt 114.0 lb

## 2018-05-03 DIAGNOSIS — F0391 Unspecified dementia with behavioral disturbance: Secondary | ICD-10-CM

## 2018-05-03 DIAGNOSIS — F03B18 Unspecified dementia, moderate, with other behavioral disturbance: Secondary | ICD-10-CM

## 2018-05-03 MED ORDER — ESCITALOPRAM OXALATE 5 MG PO TABS: 5.0000 mg | ORAL_TABLET | Freq: Every day | ORAL | 11 refills | Status: DC
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
## 2018-05-03 NOTE — Patient Instructions (Addendum)
1. Start Escitalopram (Lexapro) 5mg  daily. Would avoid alprazolam/Xanax in elderly patients 2. Stop Donepezil  3. Continue Depakote as prescribed 4. Follow-up in 6 months, call for any changes  FALL PRECAUTIONS: Be cautious when walking. Scan the area for obstacles that may increase the risk of trips and falls. When getting up in the mornings, sit up at the edge of the bed for a few minutes before getting out of bed. Consider elevating the bed at the head end to avoid drop of blood pressure when getting up. Walk always in a well-lit room (use night lights in the walls). Avoid area rugs or power cords from appliances in the middle of the walkways. Use a walker or a cane if necessary and consider physical therapy for balance exercise. Get your eyesight checked regularly.  HOME SAFETY: Consider the safety of the kitchen when operating appliances like stoves, microwave oven, and blender. Consider having supervision and share cooking responsibilities until no longer able to participate in those. Accidents with firearms and other hazards in the house should be identified and addressed as well.  ABILITY TO BE LEFT ALONE: If patient is unable to contact 911 operator, consider using LifeLine, or when the need is there, arrange for someone to stay with patients. Smoking is a fire hazard, consider supervision or cessation. Risk of wandering should be assessed by caregiver and if detected at any point, supervision and safe proof recommendations should be instituted.  MEDICATION SUPERVISION: Inability to self-administer medication needs to be constantly addressed. Implement a mechanism to ensure safe administration of the medications.  RECOMMENDATIONS FOR ALL PATIENTS WITH MEMORY PROBLEMS: 1. Continue to exercise (Recommend 30 minutes of walking everyday, or 3 hours every week) 2. Increase social interactions - continue going to [place] and enjoy social gatherings with friends and family 3. Eat healthy, avoid  fried foods and eat more fruits and vegetables 4. Maintain adequate blood pressure, blood sugar, and blood cholesterol level. Reducing the risk of stroke and cardiovascular disease also helps promoting better memory. 5. Avoid stressful situations. Live a simple life and avoid aggravations. Organize your time and prepare for the next day in anticipation. 6. Sleep well, avoid any interruptions of sleep and avoid any distractions in the bedroom that may interfere with adequate sleep quality 7. Avoid sugar, avoid sweets as there is a strong link between excessive sugar intake, diabetes, and cognitive impairment The Mediterranean diet has been shown to help patients reduce the risk of progressive memory disorders and reduces cardiovascular risk. This includes eating fish, eat fruits and green leafy vegetables, nuts like almonds and hazelnuts, walnuts, and also use olive oil. Avoid fast foods and fried foods as much as possible. Avoid sweets and sugar as sugar use has been linked to worsening of memory function.  There is always a concern of gradual progression of memory problems. If this is the case, then we may need to adjust level of care according to patient needs. Support, both to the patient and caregiver, should then be put into place.

## 2018-05-11 ENCOUNTER — Encounter: Payer: Self-pay | Admitting: Neurology

## 2018-05-11 DIAGNOSIS — F03B18 Unspecified dementia, moderate, with other behavioral disturbance: Secondary | ICD-10-CM | POA: Insufficient documentation

## 2018-05-11 DIAGNOSIS — F0391 Unspecified dementia with behavioral disturbance: Secondary | ICD-10-CM | POA: Insufficient documentation

## 2018-06-10 ENCOUNTER — Telehealth: Payer: Self-pay | Admitting: Neurology

## 2018-06-10 MED ORDER — CITALOPRAM HYDROBROMIDE 10 MG PO TABS: 10.0000 mg | ORAL_TABLET | Freq: Every day | ORAL | 3 refills | Status: DC

## 2018-06-10 NOTE — Telephone Encounter (Signed)
Instead of Lexapro, start citalopram 10mg  daily.

## 2018-06-10 NOTE — Telephone Encounter (Signed)
Patient daughter states medication lexapro is making pts aggression worse and wants to know what to do or if there is another medication can be faxed in to  [phone] where patient lives.
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
## 2018-06-14 ENCOUNTER — Other Ambulatory Visit: Payer: Self-pay

## 2018-06-14 ENCOUNTER — Telehealth: Payer: Self-pay | Admitting: Neurology

## 2018-06-14 MED ORDER — CITALOPRAM HYDROBROMIDE 10 MG PO TABS: 10.0000 mg | ORAL_TABLET | Freq: Every day | ORAL | 3 refills | Status: DC

## 2018-06-14 NOTE — Telephone Encounter (Signed)
Rx for Celexa #90 with 3 refills has been faxed. It was not faxed out of Friday due to no provider in office to sign Rx.

## 2018-06-14 NOTE — Telephone Encounter (Signed)
Patient daughter wants to know if we faxed over a new medication RX for her mother to the memory care place she is staying at. Please call jenna and let her know

## 2018-06-14 NOTE — Telephone Encounter (Signed)
Rx sent tot fax number below

## 2018-12-14 ENCOUNTER — Ambulatory Visit (INDEPENDENT_AMBULATORY_CARE_PROVIDER_SITE_OTHER): Payer: Medicare Other | Admitting: Neurology

## 2018-12-14 ENCOUNTER — Encounter

## 2018-12-14 ENCOUNTER — Encounter: Payer: Self-pay | Admitting: Neurology

## 2018-12-14 ENCOUNTER — Other Ambulatory Visit: Payer: Self-pay

## 2018-12-14 VITALS — BP 124/60 | HR 68 | Wt 113.0 lb

## 2018-12-14 DIAGNOSIS — F03B18 Unspecified dementia, moderate, with other behavioral disturbance: Secondary | ICD-10-CM

## 2018-12-14 DIAGNOSIS — F0391 Unspecified dementia with behavioral disturbance: Secondary | ICD-10-CM

## 2018-12-14 MED ORDER — CITALOPRAM HYDROBROMIDE 10 MG PO TABS: 10.0000 mg | ORAL_TABLET | Freq: Every day | ORAL | 3 refills | Status: AC

## 2018-12-14 MED ORDER — DIVALPROEX SODIUM 125 MG PO CSDR: DELAYED_RELEASE_CAPSULE | ORAL | 3 refills | Status: AC

## 2018-12-14 NOTE — Patient Instructions (Signed)
1. Stop the Lexapro 2. Continue citalopram and Depakote 3. Follow-up in 6 months or so, call for any changes  FALL PRECAUTIONS: Be cautious when walking. Scan the area for obstacles that may increase the risk of trips and falls. When getting up in the mornings, sit up at the edge of the bed for a few minutes before getting out of bed. Consider elevating the bed at the head end to avoid drop of blood pressure when getting up. Walk always in a well-lit room (use night lights in the walls). Avoid area rugs or power cords from appliances in the middle of the walkways. Use a walker or a cane if necessary and consider physical therapy for balance exercise. Get your eyesight checked regularly.  ABILITY TO BE LEFT ALONE: If patient is unable to contact 911 operator, consider using LifeLine, or when the need is there, arrange for someone to stay with patients. Smoking is a fire hazard, consider supervision or cessation. Risk of wandering should be assessed by caregiver and if detected at any point, supervision and safe proof recommendations should be instituted.  RECOMMENDATIONS FOR ALL PATIENTS WITH MEMORY PROBLEMS: 1. Continue to exercise (Recommend 30 minutes of walking everyday, or 3 hours every week) 2. Increase social interactions - continue going to [place] and enjoy social gatherings with friends and family 3. Eat healthy, avoid fried foods and eat more fruits and vegetables 4. Maintain adequate blood pressure, blood sugar, and blood cholesterol level. Reducing the risk of stroke and cardiovascular disease also helps promoting better memory. 5. Avoid stressful situations. Live a simple life and avoid aggravations. Organize your time and prepare for the next day in anticipation. 6. Sleep well, avoid any interruptions of sleep and avoid any distractions in the bedroom that may interfere with adequate sleep quality 7. Avoid sugar, avoid sweets as there is a strong link between excessive sugar intake,  diabetes, and cognitive impairment The Mediterranean diet has been shown to help patients reduce the risk of progressive memory disorders and reduces cardiovascular risk. This includes eating fish, eat fruits and green leafy vegetables, nuts like almonds and hazelnuts, walnuts, and also use olive oil. Avoid fast foods and fried foods as much as possible. Avoid sweets and sugar as sugar use has been linked to worsening of memory function.  There is always a concern of gradual progression of memory problems. If this is the case, then we may need to adjust level of care according to patient needs. Support, both to the patient and caregiver, should then be put into place.
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
## 2019-05-15 ENCOUNTER — Other Ambulatory Visit: Payer: Self-pay

## 2019-05-15 ENCOUNTER — Encounter: Payer: Medicare Other | Admitting: Internal Medicine

## 2019-05-15 NOTE — Progress Notes (Unsigned)
    [facility] Consult Note Telephone: [phone]  Fax: [phone]  PATIENT NAME: Jessica Hood DOB: [birth year] MRN: [id number]  PRIMARY CARE PROVIDER:   Maxwell Caul, NP  REFERRING PROVIDER:  Maxwell Caul, NP [place],   [address]  RESPONSIBLE PARTY:     ASSESSMENT:        RECOMMENDATIONS and PLAN:  1.  I spent *** minutes providing this consultation,  from *** to ***. More than 50% of the time in this consultation was spent coordinating communication.   HISTORY OF PRESENT ILLNESS:  Jessica Hood is a 85 y.o. year old female with multiple medical problems including ***. Palliative Care was asked to help address goals of care.   CODE STATUS:   PPS: 0% HOSPICE ELIGIBILITY/DIAGNOSIS: TBD  PAST MEDICAL HISTORY:  Past Medical History:  Diagnosis Date  . Anxiety and depression     SOCIAL HX:  Social History   Tobacco Use  . Smoking status: Never Smoker  . Smokeless tobacco: Never Used  Substance Use Topics  . Alcohol use: Yes    ALLERGIES:  Allergies  Allergen Reactions  . Sulfa Antibiotics      PERTINENT MEDICATIONS:  Outpatient Encounter Medications as of 05/15/2019  Medication Sig  . acetaminophen (TYLENOL) 325 MG tablet Take 650 mg by mouth every 6 (six) hours as needed.  . ALPRAZolam (XANAX) 0.25 MG tablet Take 0.25 mg by mouth 2 (two) times daily.  . cholecalciferol (VITAMIN D) 1000 units tablet Take 1,000 Units by mouth daily.  . citalopram (CELEXA) 10 MG tablet Take 1 tablet (10 mg total) by mouth daily.  . divalproex (DEPAKOTE SPRINKLE) 125 MG capsule Take 1 cap twice a day  . feeding supplement, ENSURE ENLIVE, (ENSURE ENLIVE) LIQD Take 237 mLs by mouth daily.  . fexofenadine (ALLEGRA) 180 MG tablet Take 180 mg by mouth daily.  Marland Kitchen FLUTICASONE PROPIONATE NA Place into the nose daily.  Marland Kitchen guaifenesin (ROBITUSSIN) 100 MG/5ML syrup Take 200 mg by mouth 3 (three) times daily as needed for  cough.  . meloxicam (MOBIC) 7.5 MG tablet Take 7.5 mg by mouth daily.  . Menthol, Topical Analgesic, (BIOFREEZE EX) Apply topically.  . montelukast (SINGULAIR) 10 MG tablet Take 10 mg by mouth at bedtime.  . solifenacin (VESICARE) 5 MG tablet Take 5 mg by mouth daily.   No facility-administered encounter medications on file as of 05/15/2019.     PHYSICAL EXAM:   General: NAD, frail appearing, thin Cardiovascular: regular rate and rhythm Pulmonary: clear ant fields Abdomen: soft, nontender, + bowel sounds GU: no suprapubic tenderness Extremities: no edema, no joint deformities Skin: no rashes Neurological: Weakness but otherwise nonfocal  Julianne Handler, NP

## 2019-05-19 ENCOUNTER — Non-Acute Institutional Stay: Payer: Medicare Other | Admitting: Internal Medicine

## 2019-05-19 DIAGNOSIS — Z515 Encounter for palliative care: Secondary | ICD-10-CM

## 2019-06-07 ENCOUNTER — Encounter: Payer: Self-pay | Admitting: Internal Medicine

## 2019-06-07 NOTE — Progress Notes (Signed)
Aug 7th, 2020 [facility] Consult Note Telephone: [phone]  Fax: [phone]  Due to the current COVID-19 infection/crises, the patient and family prefer, and have given their verbal consent for, a provider visit via telehealth, from my office. HIPPA policies of confidentially were discussed.  PATIENT NAME: Jessica Hood DOB: [birth year] MRN: [id number] [facility] Phone: [phone] PRIMARY CARE PROVIDER:      Cristina GongKing, Leslie, NP [address] [place] [id number]  REFERRING PROVIDER:  Dr. Nelta NumbersSaliha Hood; [facility] Jessica Hood [facility]er[facility] (LOV 12/13/2018)  RESPONSIBLE PARTY:  (daughter) Jessica BienenstockJenna Hood [phone]  ASSESSMENT / RECOMMENDATIONS and PLAN:  1. Advance Care Planning:  A. Advanced Care Directives: Full code; plan to discuss with patient's daughter in follow up. B. Goals of Care: To be addressed when I can establish contact with patient's daughter  2. Cognitive / Functional status:  Patient is conversant but confused. Knows that she is not home, but unsure of the place. Speech is clear but off topic; tends to ramble. She doesn't recognize staff members. She is conversant. Her speech is clear but off topic. Staff report no behavior management problems. She is on Depakote and citalopram. Patient needs assist with dressing and hygiene. She is independent in transfers and ambulation. Patient is occasionally incontinent of bowel and bladder; does best with reminders. No pain, dyspnea, or skin issues. Weight reported as 116lbs. Staff note no signs of weight loss.   3. Family Supports: Daughter Jessica StanfordJenna  4. Follow up Palliative Care Visit: Friday Oct 2nd At 11:30am  I spent 30 minutes providing this consultation from noon to 12:30pm. More than 50% of the time in this consultation was spent coordinating communication.   HISTORY OF PRESENT ILLNESS:  Jessica Hood is a 85 y.o. female with moderate  dementia (with behavioral changes), spina bifida (associated chronic back and bilat LE pain), anxiety, depression, peripheral vascular disease, anemia, overactive bladder, and osteoarthritis. Palliative Care was asked to help address goals of care.   CODE STATUS: Full  PPS: 40%  HOSPICE ELIGIBILITY/DIAGNOSIS: TBD  PAST MEDICAL HISTORY:  Past Medical History:  Diagnosis Date  . Anxiety and depression     SOCIAL HX:  Social History   Tobacco Use  . Smoking status: Never Smoker  . Smokeless tobacco: Never Used  Substance Use Topics  . Alcohol use: Yes    ALLERGIES:  Allergies  Allergen Reactions  . Sulfa Antibiotics      PERTINENT MEDICATIONS:  Outpatient Encounter Medications as of 05/19/2019  Medication Sig  . acetaminophen (TYLENOL) 325 MG tablet Take 650 mg by mouth every 6 (six) hours as needed.  . ALPRAZolam (XANAX) 0.25 MG tablet Take 0.25 mg by mouth 2 (two) times daily.  . cholecalciferol (VITAMIN D) 1000 units tablet Take 1,000 Units by mouth daily.  . citalopram (CELEXA) 10 MG tablet Take 1 tablet (10 mg total) by mouth daily.  . divalproex (DEPAKOTE SPRINKLE) 125 MG capsule Take 1 cap twice a day  . feeding supplement, ENSURE ENLIVE, (ENSURE ENLIVE) LIQD Take 237 mLs by mouth daily.  . fexofenadine (ALLEGRA) 180 MG tablet Take 180 mg by mouth daily.  Marland Kitchen. FLUTICASONE PROPIONATE NA Place into the nose daily.  Marland Kitchen. guaifenesin (ROBITUSSIN) 100 MG/5ML syrup Take 200 mg by mouth 3 (three) times daily as needed for cough.  . meloxicam (MOBIC) 7.5 MG tablet Take 7.5 mg by mouth daily.  . Menthol, Topical Analgesic, (BIOFREEZE EX) Apply topically.  .Marland Kitchen  montelukast (SINGULAIR) 10 MG tablet Take 10 mg by mouth at bedtime.  . solifenacin (VESICARE) 5 MG tablet Take 5 mg by mouth daily.   No facility-administered encounter medications on file as of 05/19/2019.     PHYSICAL EXAM:   Limited PE d/t telehealth nature of visit.  [facility] telehealth facilitated by facility staff Well  nourished, smiling, pleasantly conversant elderly female in no distress. She is without dyspnea; observed ambulating about independently.  No contractures or LE swelling.  Neuro: grossly non-focal.  Jessica Handler, NP

## 2019-06-17 ENCOUNTER — Emergency Department (HOSPITAL_COMMUNITY)
Admission: EM | Admit: 2019-06-17 | Discharge: 2019-06-17 | Disposition: A | Discharge status: Home / Self Care | Payer: Medicare Other | Attending: Emergency Medicine | Admitting: Emergency Medicine

## 2019-06-17 ENCOUNTER — Other Ambulatory Visit: Payer: Self-pay

## 2019-06-17 ENCOUNTER — Emergency Department (HOSPITAL_COMMUNITY): Payer: Medicare Other

## 2019-06-17 DIAGNOSIS — W19XXXA Unspecified fall, initial encounter: Secondary | ICD-10-CM

## 2019-06-17 DIAGNOSIS — W1839XA Other fall on same level, initial encounter: Secondary | ICD-10-CM | POA: Insufficient documentation

## 2019-06-17 DIAGNOSIS — Y92129 Unspecified place in nursing home as the place of occurrence of the external cause: Secondary | ICD-10-CM | POA: Diagnosis not present

## 2019-06-17 DIAGNOSIS — F0390 Unspecified dementia without behavioral disturbance: Secondary | ICD-10-CM | POA: Diagnosis not present

## 2019-06-17 DIAGNOSIS — Y939 Activity, unspecified: Secondary | ICD-10-CM | POA: Insufficient documentation

## 2019-06-17 DIAGNOSIS — Z79899 Other long term (current) drug therapy: Secondary | ICD-10-CM | POA: Diagnosis not present

## 2019-06-17 DIAGNOSIS — Y999 Unspecified external cause status: Secondary | ICD-10-CM | POA: Diagnosis not present

## 2019-06-17 DIAGNOSIS — S0990XA Unspecified injury of head, initial encounter: Secondary | ICD-10-CM | POA: Insufficient documentation

## 2019-06-17 LAB — CBC
HCT: 40.1 % (ref 36.0–46.0)
Hemoglobin: 12.7 g/dL (ref 12.0–15.0)
MCH: 31.6 pg (ref 26.0–34.0)
MCHC: 31.7 g/dL (ref 30.0–36.0)
MCV: 99.8 fL (ref 80.0–100.0)
Platelets: 240 10*3/uL (ref 150–400)
RBC: 4.02 MIL/uL (ref 3.87–5.11)
RDW: 13.2 % (ref 11.5–15.5)
WBC: 7.6 10*3/uL (ref 4.0–10.5)
nRBC: 0 % (ref 0.0–0.2)

## 2019-06-17 LAB — BASIC METABOLIC PANEL
Anion gap: 8 (ref 5–15)
BUN: 27 mg/dL — ABNORMAL HIGH (ref 8–23)
CO2: 29 mmol/L (ref 22–32)
Calcium: 9.2 mg/dL (ref 8.9–10.3)
Chloride: 105 mmol/L (ref 98–111)
Creatinine, Ser: 0.94 mg/dL (ref 0.44–1.00)
GFR calc Af Amer: >60 mL/min (ref >60)
GFR calc non Af Amer: 54 mL/min — ABNORMAL LOW (ref >60)
Glucose, Bld: 97 mg/dL (ref 70–99)
Potassium: 4.3 mmol/L (ref 3.5–5.1)
Sodium: 142 mmol/L (ref 135–145)

## 2019-06-17 LAB — PROTIME-INR
INR: 1 (ref 0.8–1.2)
Prothrombin Time: 13.4 seconds (ref 11.4–15.2)

## 2019-06-17 LAB — APTT: aPTT: 27 seconds (ref 24–36)

## 2019-06-17 NOTE — ED Provider Notes (Signed)
Medical screening examination/treatment/procedure(s) were conducted as a shared visit with non-physician practitioner(s) and myself.  I personally evaluated the patient during the encounter.   Pt presents to the ED after a fall.  Hx limited by pt's dementia.  Unable to remember what happened or even that she is in the hospital.   Alert and answers questions, follows commands.  Initial CT report with small amount of subarachnoid blood.  Will consult with neurosurgery.   Dorie Rank, MD 06/17/19 1400

## 2019-06-17 NOTE — Discharge Instructions (Addendum)
Please follow up with your PCP regarding your ED visit today °

## 2019-06-17 NOTE — ED Provider Notes (Signed)
[facility]-EMERGENCY DEPT Provider Note   CSN: [id number] Arrival date & time: 06/17/19  1308     History   Chief Complaint Chief Complaint  Patient presents with   Fall   LEVEL 5 CAVEAT - DEMENTIA  HPI Jessica Hood is a 85 y.o. female with PMHx dementia sent to the ED via EMS for mechanical fall that occurred while at nursing home today.  Triage report it was a witnessed fall.  Patient did hit her head but did not have any loss of consciousness.  She is not anticoagulated.  She has been complaining of some bilateral hip pain as well as left knee pain.   The history is provided by the patient and the nursing home.         Past Medical History:  Diagnosis Date   Anxiety and depression     Patient Active Problem List   Diagnosis Date Noted   Moderate dementia with behavioral disturbance (HCC) 05/11/2018    No past surgical history on file.   OB History   No obstetric history on file.      Home Medications    Prior to Admission medications   Medication Sig Start Date End Date Taking? Authorizing Provider  acetaminophen (TYLENOL) 325 MG tablet Take 650 mg by mouth every 6 (six) hours as needed.   Yes [provider]  albuterol (VENTOLIN HFA) 108 (90 Base) MCG/ACT inhaler Inhale 2 puffs into the lungs every 6 (six) hours as needed for wheezing or shortness of breath.   Yes [provider]  cholecalciferol (VITAMIN D) 1000 units tablet Take 1,000 Units by mouth daily.   Yes [provider]  citalopram (CELEXA) 10 MG tablet Take 1 tablet (10 mg total) by mouth daily. 12/14/18  Yes Van ClinesAquino, Karen M, MD  divalproex (DEPAKOTE SPRINKLE) 125 MG capsule Take 1 cap twice a day Patient taking differently: Take 125 mg by mouth 2 (two) times daily. Take 1 cap twice a day 12/14/18  Yes Van ClinesAquino, Karen M, MD  escitalopram (LEXAPRO) 5 MG tablet Take 5 mg by mouth daily.   Yes [provider]  feeding supplement, ENSURE ENLIVE,  (ENSURE ENLIVE) LIQD Take 237 mLs by mouth daily.   Yes [provider]  fexofenadine (ALLEGRA) 180 MG tablet Take 180 mg by mouth daily.   Yes [provider]  FLUTICASONE PROPIONATE NA Place 2 sprays into the nose daily.    Yes [provider]  guaifenesin (ROBITUSSIN) 100 MG/5ML syrup Take 200 mg by mouth 3 (three) times daily as needed for cough.   Yes [provider]  meloxicam (MOBIC) 7.5 MG tablet Take 7.5 mg by mouth daily.   Yes [provider]  montelukast (SINGULAIR) 10 MG tablet Take 10 mg by mouth at bedtime.   Yes [provider]  solifenacin (VESICARE) 5 MG tablet Take 5 mg by mouth daily.   Yes [provider]  vitamin B-12 (CYANOCOBALAMIN) 1000 MCG tablet Take 1,000 mcg by mouth daily.   Yes [provider]    Family History No family history on file.  Social History Social History   Tobacco Use   Smoking status: Never Smoker   Smokeless tobacco: Never Used  Substance Use Topics   Alcohol use: Yes   Drug use: Not Currently     Allergies   Sulfa antibiotics   Review of Systems Review of Systems  Unable to perform ROS: Dementia  Musculoskeletal: Positive for arthralgias.  Physical Exam Updated Vital Signs BP 106/90 (BP Location: Right Arm)    Pulse (!) 55    Temp 98 F (36.7 C) (Oral)    Resp 18    SpO2 98%   Physical Exam Vitals signs and nursing note reviewed.  Constitutional:      Appearance: She is not ill-appearing.     Comments: Pleasantly demented  HENT:     Head: Normocephalic and atraumatic.  Eyes:     Conjunctiva/sclera: Conjunctivae normal.  Neck:     Musculoskeletal: Neck supple.  Cardiovascular:     Rate and Rhythm: Normal rate and regular rhythm.     Pulses: Normal pulses.  Pulmonary:     Effort: Pulmonary effort is normal.     Breath sounds: Normal breath sounds. No wheezing, rhonchi or rales.  Abdominal:     Palpations: Abdomen is soft.      Tenderness: There is no abdominal tenderness. There is no guarding or rebound.  Musculoskeletal:     Comments: No C, T, or L midline spinal tenderness  Mild tenderness to bilateral hips and left knee. No obvious swelling, ecchymosis, or crepitus. Strength 5/5 to BLEs. Sensation intact throughout. Good distal pulses.   Skin:    General: Skin is warm and dry.  Neurological:     Mental Status: She is alert.      ED Treatments / Results  Labs (all labs ordered are listed, but only abnormal results are displayed) Labs Reviewed  BASIC METABOLIC PANEL - Abnormal; Notable for the following components:      Result Value   BUN 27 (*)    GFR calc non Af Amer 54 (*)    All other components within normal limits  CBC  PROTIME-INR  APTT    EKG None  Radiology Ct Head Wo Contrast  Result Date: 06/17/2019 CLINICAL DATA:  Status post fall with a blow to the head. EXAM: CT HEAD WITHOUT CONTRAST TECHNIQUE: Contiguous axial images were obtained from the base of the skull through the vertex without intravenous contrast. COMPARISON:  None. FINDINGS: Brain: There is a tiny amount of subarachnoid hemorrhage along the high right parietal convexities. No other acute abnormality including subdural hematoma, midline shift, mass, mass effect, hydrocephalus or pneumocephalus. Cortical atrophy and chronic microvascular ischemic change noted. Vascular: No hyperdense vessel or unexpected calcification. Skull: Normal. Negative for fracture or focal lesion. Sinuses/Orbits: Status post cataract surgery.  Otherwise negative. Other: None. IMPRESSION: The examination is positive for a very small amount of subarachnoid hemorrhage in the high right parietal lobe. No other acute abnormality. Atrophy and chronic microvascular ischemic change. Critical Value/emergent results were called by telephone at the time of interpretation on 06/17/2019 at 1:53 pm to [place], P.A., who verbally acknowledged these results.  Electronically Signed   By: Inge Rise M.D.   On: 06/17/2019 13:55   Ct Cervical Spine Wo Contrast  Result Date: 06/17/2019 CLINICAL DATA:  Witnessed fall striking head EXAM: CT CERVICAL SPINE WITHOUT CONTRAST TECHNIQUE: Multidetector CT imaging of the cervical spine was performed without intravenous contrast. Multiplanar CT image reconstructions were also generated. COMPARISON:  None FINDINGS: Alignment: Minimal anterolisthesis C4-C5. Minimal retrolisthesis C5-C6. Remaining alignments normal. Skull base and vertebrae: Osseous demineralization. Visualized skull base intact. Multilevel advanced facet degenerative changes. Ankylosis of the RIGHT C4-C5 and LEFT C3-C5 facet joints. Vertebral body heights maintained. Disc space narrowing with endplate spur formation C5-C6. Additional disc space narrowing C6-C7. No fracture, subluxation or bone destruction. Soft tissues and spinal canal: Prevertebral  soft tissues normal thickness. Atherosclerotic calcifications of the LEFT carotid bifurcation. Disc levels:  Unremarkable Upper chest: Lung apices clear Other: N/A IMPRESSION: Osseous demineralization with multilevel degenerative disc and facet disease changes of the cervical spine. No acute cervical spine abnormalities. Electronically Signed   By: Ulyses Southward M.D.   On: 06/17/2019 13:56   Dg Knee Complete 4 Views Left  Result Date: 06/17/2019 CLINICAL DATA:  Pt BIB EMS from [facility]. Witnessed fall with pt hitting head. Pt c/o bilateral hip pain and left knee pain. Pt has hx of Alzheimers. EXAM: LEFT KNEE - COMPLETE 4+ VIEW COMPARISON:  None. FINDINGS: No evidence of fracture, dislocation, or joint effusion. Mild tricompartmental degenerative changes. Soft tissues are unremarkable. IMPRESSION: No acute osseous abnormality in the left knee. Electronically Signed   By: Emmaline Kluver M.D.   On: 06/17/2019 14:27   Dg Hips Bilat W Or Wo Pelvis 3-4 Views  Result Date: 06/17/2019 CLINICAL DATA:  Pt BIB EMS  from [facility]. Witnessed fall with pt hitting head. Pt c/o bilateral hip pain and left knee pain. Pt has hx of Alzheimers. EXAM: DG HIP (WITH OR WITHOUT PELVIS) 3-4V BILAT COMPARISON:  None. FINDINGS: Bilateral hip alignment appears anatomic without evidence of dislocation. There is no evidence of acute fracture. The bony pelvis appears intact. Nonobstructive bowel gas pattern. Regional soft tissues are unremarkable. IMPRESSION: No acute osseous abnormality on radiographs of the bilateral hips. Electronically Signed   By: Emmaline Kluver M.D.   On: 06/17/2019 14:24    Procedures Procedures (including critical care time)  Medications Ordered in ED Medications - No data to display   Initial Impression / Assessment and Plan / ED Course  I have reviewed the triage vital signs and the nursing notes.  Pertinent labs & imaging results that were available during my care of the patient were reviewed by me and considered in my medical decision making (see chart for details).  Clinical Course as of Jun 16 1457  Sat Jun 17, 2019  1418 Discussed case with Dr. Wynetta Emery with neurosurgery. Reports patient can be discharged back to her nursing home. No further prevention required.    [MV]    Clinical Course User Index [MV] Tanda Rockers, PA-C   A 85-year-old female who presents with a witnessed fall at nursing home today.  Patient did hit her head.  No loss of consciousness.  She is not anticoagulated.  Per triage note she was complaining of some bilateral hip pain and left knee pain.  While in the room she has no complaints at this time.  She seems confused as to why she is here.  She does have a history of dementia.  History limited due to patient's dementia.  Asked to lift legs she troubles her leg around up in the ER bilaterally.  Very much doubt acute fractures of hips today.  Will obtain x-rays at this time of hips and left knee.  CT head and CT C-spine ordered as well.  Anticipate discharge home    Call from radiologist.  He appreciates a subarachnoid hemorrhage on CT scan.  Will consult neurosurgery at this time. Will add screening labs at this time.   Neurosurgery reports there is no additional prevention required - can be discharged home. Awaiting xrays and labs prior to discharge.   Xray of hips and left knee unremarkable. Labs reassuring as well. Will discharge patient back to her facility at this time.   This note was prepared using Dragon voice  recognition software and may include unintentional dictation errors due to the inherent limitations of voice recognition software.        Final Clinical Impressions(s) / ED Diagnoses   Final diagnoses:  Fall, initial encounter  Minor head injury, initial encounter    ED Discharge Orders    None       Tanda RockersVenter, Courtne Lighty, PA-C 06/17/19 1701    Linwood DibblesKnapp, Jon, MD 06/18/19 1207

## 2019-06-17 NOTE — ED Notes (Signed)
An After Visit Summary was printed and given to the patient. Discharge instructions given and no further questions at this time, pt leaving with [facility].

## 2019-06-17 NOTE — ED Triage Notes (Addendum)
Pt BIB EMS from [facility]. Witnessed fall with pt hitting head. Facility denies LOC. Pt c/o bilateral hip pain, L knee pain. Small hematoma to back of head. Pt has hx of Alzheimers. Pt not on blood thinners.   98% RA

## 2019-06-17 NOTE — ED Notes (Signed)
RN attempted to call report to [facility] [phone]-- no answer.

## 2019-06-17 NOTE — ED Notes (Signed)
[facility] called  

## 2019-06-29 ENCOUNTER — Emergency Department (HOSPITAL_COMMUNITY): Payer: Medicare Other

## 2019-06-29 ENCOUNTER — Encounter (HOSPITAL_COMMUNITY): Payer: Self-pay

## 2019-06-29 ENCOUNTER — Emergency Department (HOSPITAL_COMMUNITY)
Admission: EM | Admit: 2019-06-29 | Discharge: 2019-06-29 | Disposition: A | Discharge status: Home / Self Care | Payer: Medicare Other | Attending: Emergency Medicine | Admitting: Emergency Medicine

## 2019-06-29 DIAGNOSIS — Z23 Encounter for immunization: Secondary | ICD-10-CM | POA: Diagnosis not present

## 2019-06-29 DIAGNOSIS — F0391 Unspecified dementia with behavioral disturbance: Secondary | ICD-10-CM | POA: Diagnosis not present

## 2019-06-29 DIAGNOSIS — Y9389 Activity, other specified: Secondary | ICD-10-CM | POA: Diagnosis not present

## 2019-06-29 DIAGNOSIS — S01311A Laceration without foreign body of right ear, initial encounter: Secondary | ICD-10-CM

## 2019-06-29 DIAGNOSIS — Y92121 Bathroom in nursing home as the place of occurrence of the external cause: Secondary | ICD-10-CM | POA: Diagnosis not present

## 2019-06-29 DIAGNOSIS — W1812XA Fall from or off toilet with subsequent striking against object, initial encounter: Secondary | ICD-10-CM | POA: Diagnosis not present

## 2019-06-29 DIAGNOSIS — Z79899 Other long term (current) drug therapy: Secondary | ICD-10-CM | POA: Diagnosis not present

## 2019-06-29 DIAGNOSIS — Y999 Unspecified external cause status: Secondary | ICD-10-CM | POA: Insufficient documentation

## 2019-06-29 DIAGNOSIS — W19XXXA Unspecified fall, initial encounter: Secondary | ICD-10-CM

## 2019-06-29 DIAGNOSIS — S0990XA Unspecified injury of head, initial encounter: Secondary | ICD-10-CM

## 2019-06-29 MED ORDER — BACITRACIN ZINC 500 UNIT/GM EX OINT: TOPICAL_OINTMENT | Freq: Two times a day (BID) | CUTANEOUS | Status: DC

## 2019-06-29 MED ORDER — TETANUS-DIPHTH-ACELL PERTUSSIS 5-2.5-18.5 LF-MCG/0.5 IM SUSP
0.5000 mL | Freq: Once | INTRAMUSCULAR | Status: AC
  Administered 2019-06-29: 18:00:00 0.5 mL via INTRAMUSCULAR
  Filled 2019-06-29: qty 0.5

## 2019-06-29 MED ORDER — LIDOCAINE-EPINEPHRINE-TETRACAINE (LET) SOLUTION: 3.0000 mL | Freq: Once | NASAL | Status: DC

## 2019-06-29 MED ORDER — BACITRACIN ZINC 500 UNIT/GM EX OINT
TOPICAL_OINTMENT | CUTANEOUS | Status: AC
  Filled 2019-06-29: qty 0.9

## 2019-06-29 NOTE — ED Provider Notes (Signed)
[facility]   CSN: [id number] Arrival date & time: 06/29/19  1425     History   Chief Complaint Chief Complaint  Patient presents with   Fall    HPI Jessica Hood is a 85 y.o. female with history of dementia presents today via EMS following fall from toilet. History obtained from triage as patient with history of dementia.  Patient with fall forward from toilet witnessed by caregivers, no blood thinner use or LOC. Small laceration noted behind right ear, bleeding controlled with direct pressure PTA. No other injuries noted. Patient arrives in cervical collar.     HPI  Past Medical History:  Diagnosis Date   Anxiety and depression     Patient Active Problem List   Diagnosis Date Noted   Moderate dementia with behavioral disturbance (HCC) 05/11/2018    History reviewed. No pertinent surgical history.   OB History   No obstetric history on file.      Home Medications    Prior to Admission medications   Medication Sig Start Date End Date Taking? Authorizing Provider  acetaminophen (TYLENOL) 325 MG tablet Take 650 mg by mouth every 6 (six) hours as needed.   Yes [provider]  albuterol (VENTOLIN HFA) 108 (90 Base) MCG/ACT inhaler Inhale 2 puffs into the lungs every 6 (six) hours as needed for wheezing or shortness of breath.   Yes [provider]  cholecalciferol (VITAMIN D) 1000 units tablet Take 1,000 Units by mouth daily.   Yes [provider]  citalopram (CELEXA) 10 MG tablet Take 1 tablet (10 mg total) by mouth daily. 12/14/18  Yes Van Clines, MD  divalproex (DEPAKOTE SPRINKLE) 125 MG capsule Take 1 cap twice a day Patient taking differently: Take 125 mg by mouth 2 (two) times daily. Take 1 cap twice a day 12/14/18  Yes Van Clines, MD  escitalopram (LEXAPRO) 5 MG tablet Take 5 mg by mouth daily.   Yes [provider]  feeding supplement, ENSURE ENLIVE, (ENSURE  ENLIVE) LIQD Take 237 mLs by mouth daily.   Yes [provider]  fexofenadine (ALLEGRA) 180 MG tablet Take 180 mg by mouth daily.   Yes [provider]  FLUTICASONE PROPIONATE NA Place 2 sprays into the nose daily.    Yes [provider]  guaifenesin (ROBITUSSIN) 100 MG/5ML syrup Take 200 mg by mouth 3 (three) times daily as needed for cough.   Yes [provider]  meloxicam (MOBIC) 7.5 MG tablet Take 7.5 mg by mouth daily.   Yes [provider]  montelukast (SINGULAIR) 10 MG tablet Take 10 mg by mouth at bedtime.   Yes [provider]  solifenacin (VESICARE) 5 MG tablet Take 5 mg by mouth daily.   Yes [provider]  vitamin B-12 (CYANOCOBALAMIN) 1000 MCG tablet Take 1,000 mcg by mouth daily.   Yes [provider]    Family History No family history on file.  Social History Social History   Tobacco Use   Smoking status: Never Smoker   Smokeless tobacco: Never Used  Substance Use Topics   Alcohol use: Yes   Drug use: Not Currently     Allergies   Sulfa antibiotics   Review of Systems Review of Systems  Unable to perform ROS: Dementia    Physical Exam Updated Vital Signs BP (!) 171/72 (BP Location: Right Arm)    Pulse 96    Temp 98.8 F (37.1 C) (Oral)  Resp 15    Wt 51 kg    SpO2 97%   Physical Exam Constitutional:      General: She is not in acute distress.    Appearance: Normal appearance. She is well-developed and normal weight. She is not ill-appearing or diaphoretic.  HENT:     Head: Normocephalic. No raccoon eyes or Battle's sign.     Jaw: There is normal jaw occlusion. No trismus.      Right Ear: Laceration present.     Left Ear: External ear normal.     Ears:      Comments: Superficial laceration posterior right pinna. No active bleeding. Well aligned without intervention.    Nose: Nose normal. No rhinorrhea.     Right Nostril: No epistaxis.     Left Nostril: No epistaxis.      Mouth/Throat:     Mouth: Mucous membranes are moist.     Pharynx: Oropharynx is clear.  Eyes:     General: Vision grossly intact. Gaze aligned appropriately.     Extraocular Movements: Extraocular movements intact.     Conjunctiva/sclera: Conjunctivae normal.     Pupils: Pupils are equal, round, and reactive to light.  Neck:     Musculoskeletal: Full passive range of motion without pain, normal range of motion and neck supple.     Trachea: Trachea and phonation normal. No tracheal deviation.  Pulmonary:     Effort: Pulmonary effort is normal. No accessory muscle usage or respiratory distress.     Breath sounds: Normal air entry.  Chest:     Chest wall: No deformity, tenderness or crepitus.  Abdominal:     General: There is no distension.     Palpations: Abdomen is soft.     Tenderness: There is no abdominal tenderness. There is no guarding or rebound.  Musculoskeletal: Normal range of motion.     Comments: No midline C/T/L spinal tenderness to palpation, no paraspinal muscle tenderness, no deformity, crepitus, or step-off noted. No sign of injury to the neck or back. - No tenderness with compression of this pelvis. Patient able to actively bring bilateral knees to chest without evidence of pain. Patient pulls self to seated position without evidence of discomfort.  - All major joints brought through range of motion without crepitus or evidence of pain.  Feet:     Right foot:     Protective Sensation: 3 sites tested. 3 sites sensed.     Left foot:     Protective Sensation: 3 sites tested. 3 sites sensed.  Skin:    General: Skin is warm and dry.     Findings: Laceration present.  Neurological:     Mental Status: She is alert.     GCS: GCS eye subscore is 4. GCS verbal subscore is 5. GCS motor subscore is 6.     Comments: Speech is clear, patient oriented to self only, confusion consistent with patient's history of dementia, she is initially very pleasant with each encounter but  will occasionally try to strike, easily redirectable. She will briefly follow commands. Major Cranial nerves without deficit, no facial droop Normal strength in upper and lower extremities bilaterally including dorsiflexion and plantar flexion, strong and equal grip strength Sensation normal to light and sharp touch Moves extremities without ataxia, coordination intact  Psychiatric:     Comments: Mostly pleasantly demented but occasionally agitated and combative mostly when examining laceration.    ED Treatments / Results  Labs (all labs ordered are listed, but only abnormal  results are displayed) Labs Reviewed - No data to display  EKG None  Radiology Ct Head Wo Contrast  Result Date: 06/29/2019 CLINICAL DATA:  85 year old female with head trauma. EXAM: CT HEAD WITHOUT CONTRAST CT MAXILLOFACIAL WITHOUT CONTRAST CT CERVICAL SPINE WITHOUT CONTRAST TECHNIQUE: Multidetector CT imaging of the head, cervical spine, and maxillofacial structures were performed using the standard protocol without intravenous contrast. Multiplanar CT image reconstructions of the cervical spine and maxillofacial structures were also generated. COMPARISON:  Head CT dated 06/17/2019 FINDINGS: CT HEAD FINDINGS Brain: Moderate age-related atrophy and chronic microvascular ischemic changes. Trace linear high attenuating density noted in the right parietal sulcation (coronal series 5, image 48 and sagittal series 6, image 24) likely trace acute subarachnoid hemorrhage. This is in similar location as the prior CT of 06/17/2019. No other acute intracranial hemorrhage identified. No mass effect or midline shift. No extra-axial fluid collection. Vascular: No hyperdense vessel or unexpected calcification. Skull: Normal. Negative for fracture or focal lesion. Other: None. CT MAXILLOFACIAL FINDINGS Osseous: No fracture or mandibular dislocation. No destructive process. Orbits: Negative. No traumatic or inflammatory finding. Sinuses:  Clear. Soft tissues: None CT CERVICAL SPINE FINDINGS Alignment: No acute subluxation. Grade 1 C4-C5 anterolisthesis. Skull base and vertebrae: No acute fracture. Soft tissues and spinal canal: No prevertebral fluid or swelling. No visible canal hematoma. Disc levels: Multilevel degenerative changes most prominent at C5-C6. Multilevel facet arthropathy. Upper chest: Negative. Other: None IMPRESSION: 1. Trace right parietal subarachnoid hemorrhage. 2. No acute/traumatic cervical spine pathology. 3. No facial bone fractures. These results were called by telephone at the time of interpretation on 06/29/2019 at 3:51 pm to provider [facility] , who verbally acknowledged these results. Electronically Signed   By: Elgie Collard M.D.   On: 06/29/2019 15:56   Ct Cervical Spine Wo Contrast  Result Date: 06/29/2019 CLINICAL DATA:  85 year old female with head trauma. EXAM: CT HEAD WITHOUT CONTRAST CT MAXILLOFACIAL WITHOUT CONTRAST CT CERVICAL SPINE WITHOUT CONTRAST TECHNIQUE: Multidetector CT imaging of the head, cervical spine, and maxillofacial structures were performed using the standard protocol without intravenous contrast. Multiplanar CT image reconstructions of the cervical spine and maxillofacial structures were also generated. COMPARISON:  Head CT dated 06/17/2019 FINDINGS: CT HEAD FINDINGS Brain: Moderate age-related atrophy and chronic microvascular ischemic changes. Trace linear high attenuating density noted in the right parietal sulcation (coronal series 5, image 48 and sagittal series 6, image 24) likely trace acute subarachnoid hemorrhage. This is in similar location as the prior CT of 06/17/2019. No other acute intracranial hemorrhage identified. No mass effect or midline shift. No extra-axial fluid collection. Vascular: No hyperdense vessel or unexpected calcification. Skull: Normal. Negative for fracture or focal lesion. Other: None. CT MAXILLOFACIAL FINDINGS Osseous: No fracture or mandibular  dislocation. No destructive process. Orbits: Negative. No traumatic or inflammatory finding. Sinuses: Clear. Soft tissues: None CT CERVICAL SPINE FINDINGS Alignment: No acute subluxation. Grade 1 C4-C5 anterolisthesis. Skull base and vertebrae: No acute fracture. Soft tissues and spinal canal: No prevertebral fluid or swelling. No visible canal hematoma. Disc levels: Multilevel degenerative changes most prominent at C5-C6. Multilevel facet arthropathy. Upper chest: Negative. Other: None IMPRESSION: 1. Trace right parietal subarachnoid hemorrhage. 2. No acute/traumatic cervical spine pathology. 3. No facial bone fractures. These results were called by telephone at the time of interpretation on 06/29/2019 at 3:51 pm to provider [facility] , who verbally acknowledged these results. Electronically Signed   By: Elgie Collard M.D.   On: 06/29/2019 15:56   Ct Maxillofacial Wo Contrast  Result Date: 06/29/2019 CLINICAL DATA:  85 year old female with head trauma. EXAM: CT HEAD WITHOUT CONTRAST CT MAXILLOFACIAL WITHOUT CONTRAST CT CERVICAL SPINE WITHOUT CONTRAST TECHNIQUE: Multidetector CT imaging of the head, cervical spine, and maxillofacial structures were performed using the standard protocol without intravenous contrast. Multiplanar CT image reconstructions of the cervical spine and maxillofacial structures were also generated. COMPARISON:  Head CT dated 06/17/2019 FINDINGS: CT HEAD FINDINGS Brain: Moderate age-related atrophy and chronic microvascular ischemic changes. Trace linear high attenuating density noted in the right parietal sulcation (coronal series 5, image 48 and sagittal series 6, image 24) likely trace acute subarachnoid hemorrhage. This is in similar location as the prior CT of 06/17/2019. No other acute intracranial hemorrhage identified. No mass effect or midline shift. No extra-axial fluid collection. Vascular: No hyperdense vessel or unexpected calcification. Skull: Normal. Negative for  fracture or focal lesion. Other: None. CT MAXILLOFACIAL FINDINGS Osseous: No fracture or mandibular dislocation. No destructive process. Orbits: Negative. No traumatic or inflammatory finding. Sinuses: Clear. Soft tissues: None CT CERVICAL SPINE FINDINGS Alignment: No acute subluxation. Grade 1 C4-C5 anterolisthesis. Skull base and vertebrae: No acute fracture. Soft tissues and spinal canal: No prevertebral fluid or swelling. No visible canal hematoma. Disc levels: Multilevel degenerative changes most prominent at C5-C6. Multilevel facet arthropathy. Upper chest: Negative. Other: None IMPRESSION: 1. Trace right parietal subarachnoid hemorrhage. 2. No acute/traumatic cervical spine pathology. 3. No facial bone fractures. These results were called by telephone at the time of interpretation on 06/29/2019 at 3:51 pm [facility] , who verbally acknowledged these results. Electronically Signed   By: Elgie CollardArash  Radparvar M.D.   On: 06/29/2019 15:56    Procedures Procedures (including critical care time)  Medications Ordered in ED Medications  lidocaine-EPINEPHrine-tetracaine (LET) solution (3 mLs Topical Refused 06/29/19 1733)  bacitracin 500 UNIT/GM ointment (has no administration in time range)  bacitracin ointment (has no administration in time range)  Tdap (BOOSTRIX) injection 0.5 mL (0.5 mLs Intramuscular Given 06/29/19 1741)     Initial Impression / Assessment and Plan / ED Course  I have reviewed the triage vital signs and the nursing notes.  Pertinent labs & imaging results that were available during my care of the patient were reviewed by me and considered in my medical decision making (see chart for details).  Clinical Course as of Jun 29 2115  Thu Jun 29, 2019  1618 Discussed case with neurosurgeon Dr. Lovell SheehanJenkins who has reviewed patient's imaging today and advises that there is no acute abnormality.   [BM]    Clinical Course User Index [BM] Bill SalinasMorelli, Marybelle Giraldo A, PA-C   85 year  old female with history of dementia presents following witnessed fall from toilet at nursing facility. Arrives in cervical collar. No history of blood thinner use or LOC. Initial examination reveals a well appearing elderly female in no acute distress. Laceration noted to right ear that is well aligned with no active bleeding. She has no other sign of injury to the head. There is no sign of significant injury to the neck, back, chest, abdomen, pelvis or extremities. She is pleasant unless examining the laceration and mentation consistent with her known dementia. Plan of care at this time is to obtain CT imaging of the head and neck. - CT Head/Cspine/Maxface: IMPRESSION: 1. Trace right parietal subarachnoid hemorrhage. 2. No acute/traumatic cervical spine pathology. 3. No facial bone fractures.  - Discussed case with Dr. Donnald GarrePfeiffer, consult placed to neurosurgery. - Discussed case with neurosurgeon Dr. Lovell SheehanJenkins who has reviewed  patient's CT imaging. Dr. Lovell SheehanJenkins advises there there is no evidence of acute abnormality on patient's imaging today and no further intracranial imaging is needed at this time. - Patient reevaluated, resting comfortably and in no acute distress. Cervical collar removed . Patient ambulated in hallway with nursing staff without difficultly or evidence of discomfort. - Patient seen and evaluated by Dr. Donnald GarrePfeiffer, advises that patient's laceration is well approximated, plan of care is antibiotic ointment, gauze dressing/wound care; no indication for suture at this time. Patient to be discharged back to facility, no further workup indicated at this time. - Tdap updated. Bacitracin and wound care performed by nursing staff. On reevaluation patient is pleasant, well appearing and in no acute distress, shuffling papers on her bed.   At this time there does not appear to be any evidence of an acute emergency medical condition and the patient appears stable for discharge with appropriate  outpatient follow up. Diagnosis, aftercare instructions and return precautions printed and to be returned with patient to facility, RN to update facility of patient's return.  Patient's case rediscussed with Dr. Donnald GarrePfeiffer who agrees with plan to discharge with follow-up.   Note: Portions of this report may have been transcribed using voice recognition software. Every effort was made to ensure accuracy; however, inadvertent computerized transcription errors may still be present. Final Clinical Impressions(s) / ED Diagnoses   Final diagnoses:  Fall, initial encounter  Injury of head, initial encounter  Laceration of right ear, initial encounter    ED Discharge Orders    None       Elizabeth PalauMorelli, Rocco Kerkhoff A, PA-C 06/29/19 2118    Arby BarrettePfeiffer, Marcy, MD 06/29/19 [phone]

## 2019-06-29 NOTE — ED Notes (Signed)
Attempted to call report to [facility] place x2, with no answer. [facility] called for patient transport back to [facility].

## 2019-06-29 NOTE — ED Notes (Signed)
Patient ambulated 50 feet in the hallway with x1 assist. Patient tolerated well, but remains very confused.

## 2019-06-29 NOTE — ED Provider Notes (Signed)
Medical screening examination/treatment/procedure(s) were conducted as a shared visit with non-physician practitioner(s) and myself.  I personally evaluated the patient during the encounter.    Caregiver was present in the room with the patient when she fell forward off the toilet and struck her head.  She has a small laceration to the right ear.  Patient has been at baseline mental status.  Patient is alert and chipper.  She however is exhibiting signs of significant dementia.  She will be pleasant and interactive and laughing and then hostile and resistant.  She has a small laceration to the posterior pinna of the right ear.  This falls into alignment without suturing.  No active bleeding.  Patient is moving all extremities coordinated and purposely.  She is shuffling papers about looking at them.  Her speech is clear albeit exhibiting content consistent with dementia.  At this time, I think the patient's ear pinna laceration is amenable to local wound care.  I agree with plan of management.   Charlesetta Shanks, MD 06/29/19 [phone]

## 2019-06-29 NOTE — Discharge Instructions (Addendum)
You have been diagnosed today with fall with head injury and laceration of the right ear.  At this time there does not appear to be the presence of an emergent medical condition, however there is always the potential for conditions to change. Please read and follow the below instructions.  Please return to the Emergency Department immediately for any new or worsening symptoms. Please be sure to follow up with your Primary Care Provider within one week regarding your visit today; please call their office to schedule an appointment even if you are feeling better for a follow-up visit. Please be sure to change the patient's dressing on her right ear twice daily and apply a small amount of antibiotic ointment to avoid infection.  Follow-up with the primary care doctor within 3-4 days for recheck.  Get help right away if: You have: A very bad headache that is not helped by medicine. Trouble walking or weakness in your arms and legs. Clear or bloody fluid coming from your nose or ears. Changes in how you see (vision). Shaking movements that you cannot control. You lose your balance. You vomit. The black centers of your eyes (pupils) change in size. Your speech is slurred. You have a red streak going away from your wound. The edges of the wound open up and separate. Your wound is bleeding, and the bleeding does not stop with gentle pressure. You have a rash. You faint. You have trouble breathing. Your dizziness gets worse. You pass out. You are sleepier than normal and have trouble staying awake. Your symptoms get worse. You have any new/concerning or worsening symptoms.  Please read the additional information packets attached to your discharge summary.

## 2019-06-29 NOTE — ED Triage Notes (Signed)
Pt BIBA from [facility]. Pt was on toilet and fell forward on head. Pt has small lac on right ear. Caregivers were in room when she fell.  118/66 64 94%  97.7

## 2019-06-29 NOTE — ED Notes (Signed)
Patient picked up by daughter. Daughter updated on patient status and plan of care. Patient daughter provided with discharge paperwork to take to [facility].

## 2019-07-08 ENCOUNTER — Other Ambulatory Visit: Payer: Self-pay

## 2019-07-08 ENCOUNTER — Emergency Department (HOSPITAL_COMMUNITY): Payer: Medicare Other

## 2019-07-08 ENCOUNTER — Encounter (HOSPITAL_COMMUNITY): Payer: Self-pay

## 2019-07-08 ENCOUNTER — Emergency Department (HOSPITAL_COMMUNITY)
Admission: EM | Admit: 2019-07-08 | Discharge: 2019-07-08 | Disposition: A | Discharge status: Home / Self Care | Payer: Medicare Other | Attending: Emergency Medicine | Admitting: Emergency Medicine

## 2019-07-08 DIAGNOSIS — Y939 Activity, unspecified: Secondary | ICD-10-CM | POA: Insufficient documentation

## 2019-07-08 DIAGNOSIS — W19XXXA Unspecified fall, initial encounter: Secondary | ICD-10-CM

## 2019-07-08 DIAGNOSIS — S22080A Wedge compression fracture of T11-T12 vertebra, initial encounter for closed fracture: Secondary | ICD-10-CM | POA: Insufficient documentation

## 2019-07-08 DIAGNOSIS — F0390 Unspecified dementia without behavioral disturbance: Secondary | ICD-10-CM | POA: Insufficient documentation

## 2019-07-08 DIAGNOSIS — W010XXA Fall on same level from slipping, tripping and stumbling without subsequent striking against object, initial encounter: Secondary | ICD-10-CM | POA: Diagnosis not present

## 2019-07-08 DIAGNOSIS — Z79899 Other long term (current) drug therapy: Secondary | ICD-10-CM | POA: Insufficient documentation

## 2019-07-08 DIAGNOSIS — Z791 Long term (current) use of non-steroidal anti-inflammatories (NSAID): Secondary | ICD-10-CM | POA: Diagnosis not present

## 2019-07-08 DIAGNOSIS — M8008XA Age-related osteoporosis with current pathological fracture, vertebra(e), initial encounter for fracture: Secondary | ICD-10-CM

## 2019-07-08 DIAGNOSIS — Y999 Unspecified external cause status: Secondary | ICD-10-CM | POA: Insufficient documentation

## 2019-07-08 DIAGNOSIS — Y92129 Unspecified place in nursing home as the place of occurrence of the external cause: Secondary | ICD-10-CM | POA: Diagnosis not present

## 2019-07-08 DIAGNOSIS — S299XXA Unspecified injury of thorax, initial encounter: Secondary | ICD-10-CM | POA: Diagnosis present

## 2019-07-08 HISTORY — DX: Unspecified dementia, unspecified severity, without behavioral disturbance, psychotic disturbance, mood disturbance, and anxiety: F03.90

## 2019-07-08 HISTORY — DX: Unspecified fall, initial encounter: W19.XXXA

## 2019-07-08 LAB — CBG MONITORING, ED: Glucose-Capillary: 74 mg/dL (ref 70–99)

## 2019-07-08 NOTE — ED Notes (Signed)
Patient transported to CT 

## 2019-07-08 NOTE — ED Triage Notes (Addendum)
Patient arrived via [facility] from [facility]. Patient is at baseline and ambulatory with device. Patient had witnessed fall today. Patient fell backwards onto buttocks and lower back. Patient is complaining of headache, back pain and right sided hip pain. Patient does have bruising on knees from past falls these are not new. Patient placed in C-Collar.   Facility did not provide any paperwork on patient.

## 2019-07-08 NOTE — ED Notes (Signed)
ED Provider at bedside. 

## 2019-07-08 NOTE — ED Notes (Signed)
[facility] has been notified of patient requiring transport.

## 2019-07-08 NOTE — ED Notes (Signed)
Patient has been cleaned and changed, placed in new gown and diaper. Belongings that have been soiled or removed for the purpose of assessing have been bagged and placed on stretcher behind patient.

## 2019-07-08 NOTE — ED Provider Notes (Signed)
[facility]-EMERGENCY DEPT Provider Note   CSN: [id number] Arrival date & time: 07/08/19  [id number]     History   Chief Complaint Chief Complaint  Patient presents with  . Fall    HPI Jessica Hood is a 85 y.o. female who presents emergency department with chief complaint of fall. There is a level 5 caveat due to baseline dementia.  This was a witnessed mechanical fall.  Patient arrives from her memory care unit at [facility] facility.  This morning she tripped and fell backward onto her buttocks and lower back.  She is complaining of headache, back pain and hip pain prior to arrival.  Placed in c-collar.  On questioning the patient denies knowing she had a fall and denies any active pain.     HPI  Past Medical History:  Diagnosis Date  . Anxiety and depression   . Dementia (HCC) 07/08/2019   Per EMS from date of 07/08/2019  . Fall 07/08/2019    Patient Active Problem List   Diagnosis Date Noted  . Moderate dementia with behavioral disturbance (HCC) 05/11/2018    History reviewed. No pertinent surgical history.   OB History   No obstetric history on file.      Home Medications    Prior to Admission medications   Medication Sig Start Date End Date Taking? Authorizing Provider  acetaminophen (TYLENOL) 325 MG tablet Take 650 mg by mouth every 6 (six) hours as needed.    [provider]  albuterol (VENTOLIN HFA) 108 (90 Base) MCG/ACT inhaler Inhale 2 puffs into the lungs every 6 (six) hours as needed for wheezing or shortness of breath.    [provider]  cholecalciferol (VITAMIN D) 1000 units tablet Take 1,000 Units by mouth daily.    [provider]  citalopram (CELEXA) 10 MG tablet Take 1 tablet (10 mg total) by mouth daily. 12/14/18   Van ClinesAquino, Karen M, MD  divalproex (DEPAKOTE SPRINKLE) 125 MG capsule Take 1 cap twice a day Patient taking differently: Take 125 mg by mouth 2 (two) times daily. Take 1 cap twice a day 12/14/18    Van ClinesAquino, Karen M, MD  escitalopram (LEXAPRO) 5 MG tablet Take 5 mg by mouth daily.    [provider]  feeding supplement, ENSURE ENLIVE, (ENSURE ENLIVE) LIQD Take 237 mLs by mouth daily.    [provider]  fexofenadine (ALLEGRA) 180 MG tablet Take 180 mg by mouth daily.    [provider]  FLUTICASONE PROPIONATE NA Place 2 sprays into the nose daily.     [provider]  guaifenesin (ROBITUSSIN) 100 MG/5ML syrup Take 200 mg by mouth 3 (three) times daily as needed for cough.    [provider]  meloxicam (MOBIC) 7.5 MG tablet Take 7.5 mg by mouth daily.    [provider]  montelukast (SINGULAIR) 10 MG tablet Take 10 mg by mouth at bedtime.    [provider]  solifenacin (VESICARE) 5 MG tablet Take 5 mg by mouth daily.    [provider]  vitamin B-12 (CYANOCOBALAMIN) 1000 MCG tablet Take 1,000 mcg by mouth daily.    [provider]    Family History History reviewed. No pertinent family history.  Social History Social History   Tobacco Use  . Smoking status: Never Smoker  . Smokeless tobacco: Never Used  Substance Use Topics  . Alcohol use: Not Currently  . Drug use: Not Currently     Allergies   Sulfa antibiotics  Review of Systems Review of Systems  Unable to review systems secondary to dementia Physical Exam Updated Vital Signs BP (!) 152/67 (BP Location: Left Arm)   Pulse (!) 55   Temp 98.6 F (37 C) (Oral)   Resp 18   Ht 5\' 5"  (1.651 m)   Wt 51 kg   SpO2 94%   BMI 18.71 kg/m   Physical Exam Vitals signs and nursing note reviewed.  Constitutional:      General: She is not in acute distress.    Appearance: She is well-developed. She is not diaphoretic.     Comments: Laying in examining bed comfortably with c-collar in place.  HENT:     Head: Normocephalic and atraumatic.  Eyes:     General: No scleral icterus.    Conjunctiva/sclera: Conjunctivae normal.  Neck:      Musculoskeletal: Normal range of motion.  Cardiovascular:     Rate and Rhythm: Normal rate and regular rhythm.     Heart sounds: Normal heart sounds. No murmur. No friction rub. No gallop.   Pulmonary:     Effort: Pulmonary effort is normal. No respiratory distress.     Breath sounds: Normal breath sounds.  Abdominal:     General: Bowel sounds are normal. There is no distension.     Palpations: Abdomen is soft. There is no mass.     Tenderness: There is no abdominal tenderness. There is no guarding.  Musculoskeletal:     Comments: Normal strength in the upper and lower extremities with good range of motion.  No pain with active or passive range of motion of the bilateral hips. Midline thoracic tenderness without pain in the lumbar or pelvic region to palpation.  No bruising present.  Skin:    General: Skin is warm and dry.  Neurological:     Mental Status: She is alert.  Psychiatric:        Behavior: Behavior normal.      ED Treatments / Results  Labs (all labs ordered are listed, but only abnormal results are displayed) Labs Reviewed  CBG MONITORING, ED    EKG None  Radiology No results found.  Procedures Procedures (including critical care time)  Medications Ordered in ED Medications - No data to display   Initial Impression / Assessment and Plan / ED Course  I have reviewed the triage vital signs and the nursing notes.  Pertinent labs & imaging results that were available during my care of the patient were reviewed by me and considered in my medical decision making (see chart for details).  Clinical Course as of Jul 07 1018  Sat Jul 08, 2019  1012 GIVEN ORANGE JUICE  Glucose-Capillary: 47 [AH]    Clinical Course User Index [AH] 66, PA-C      85 year old female with mechanical fall.  CBG is 74 and patient was given orange juice.  I personally reviewed the patient's CT head and C-spine along with her plain film of the hips and thoracic spine.   All images negative except for compression fracture at T11 and T12 of indeterminate age.  I progressed with CT C-spine which shows that this is very likely old compression fracture.  Patient has no obvious bruising or deformities.  She is able to stand and has no objective weakness.  Patient will be discharged to follow-up with her primary care physician.  Seen and shared visit with Dr. 92.   Final Clinical Impressions(s) / ED Diagnoses   Final diagnoses:  None  ED Discharge Orders    None       Margarita Mail, PA-C 07/08/19 1739    Malvin Johns, MD 07/09/19 [id number]

## 2019-07-14 ENCOUNTER — Non-Acute Institutional Stay: Payer: Medicare Other | Admitting: Internal Medicine

## 2019-07-14 ENCOUNTER — Encounter: Payer: Self-pay | Admitting: Internal Medicine

## 2019-07-14 ENCOUNTER — Other Ambulatory Visit: Payer: Self-pay

## 2019-07-14 DIAGNOSIS — Z515 Encounter for palliative care: Secondary | ICD-10-CM

## 2019-07-14 NOTE — Progress Notes (Signed)
Oct 2nd, 2020 [facility] Palliative Care Consult Note Telephone: [phone]  Fax: [phone]   Due to the current COVID-19 infection/crises, the patient and family prefer, and have given their verbal consent for, a provider visit via telehealth, from my office. HIPPA policies of confidentially were discussed.   PATIENT NAME: Jessica Hood DOB: [birth year] MRN: [id number] [address] Rm 8  (move in date 12/06/2017) Phone: [phone]  PRIMARY CARE PROVIDER:      Cristina Gong, NP [address] [place],  [place] [address]   REFERRING PROVIDER:  Dr. Nelta Numbers; [facility] (LOV 12/13/2018)   RESPONSIBLE PARTY: (daughter/HCPOA) Brooke Pace [phone]   ASSESSMENT / RECOMMENDATIONS and PLAN:  1. Advance Care Planning:  A. Advanced Care Directives: HCPOA on chart. Full code. I discussed with daughter; she reconfirms this.  B. Goals of Care: To decrease risk of falls. Daughter believes most recent falls may be d/t patient's poor eyesight. She lost 2 sets of glasses. Recent eye appointment; awaiting delivery of new glasses next week.    2. Cognitive / Functional status:  Patient is conversant but confused. Knows that she is not home, but unsure of the place. Speech is clear but usually off topic; tends to ramble. Staff report no behavior management problems. Patient needs assist with dressing and hygiene. She is independent in transfers and ambulation, but increasingly unsteady and off balance. She is unable to understand use a walker. Over the last 5 weeks she has been to the ER 3 times, evaluation after falls. Patient is now incontinent of bowel and bladder. No pain, dyspnea, or skin issues. Current weight is 114.6 lbs which is roughly stable. At a height of 5'5" her BMI is 19.1kg/m2.   3. Family Supports: Daughter Jessica Hood, who has only been able to visit her mom at the time of her ER and eye doctor visits    4. Follow up Palliative Care: Friday December 18th.  I spent 30 minutes providing this consultation from 4 to 4:30pm. More than 50% of the time in this consultation was spent coordinating communication.    HISTORY OF PRESENT ILLNESS:  Jessica Hood is a 85 y.o. female with moderate dementia (with behavioral changes), spina bifida (associated chronic back and bilat LE pain), anxiety, depression, peripheral vascular disease, anemia, overactive bladder, and osteoarthritis.  ER visits for falls:  06/17/19: fall and hit her head; (CT) tiny subarachnoid bleed. Hip and L knee x-ray unrmarkable 06/29/2019: fall forward from toilet. Small laceration behind R ear.  07/08/2019: Tripped and fell backward. CT scan: old compression fractures T11 and T12. This is a f/u Palliative Care visit from 05/19/2019.    CODE STATUS: Full   PPS: 40%   HOSPICE ELIGIBILITY/DIAGNOSIS: TBD  PAST MEDICAL HISTORY:  Past Medical History:  Diagnosis Date  . Anxiety and depression   . Dementia (HCC) 07/08/2019   Per EMS from date of 07/08/2019  . Fall 07/08/2019    SOCIAL HX:  Social History   Tobacco Use  . Smoking status: Never Smoker  . Smokeless tobacco: Never Used  Substance Use Topics  . Alcohol use: Not Currently    ALLERGIES:  Allergies  Allergen Reactions  . Sulfa Antibiotics      PERTINENT MEDICATIONS:  Outpatient Encounter Medications as of 07/14/2019  Medication Sig  . acetaminophen (TYLENOL) 325 MG tablet Take 650 mg by mouth every 6 (six) hours as needed.  Marland Kitchen albuterol (VENTOLIN HFA) 108 (90 Base)  MCG/ACT inhaler Inhale 2 puffs into the lungs every 6 (six) hours as needed for wheezing or shortness of breath.  . cholecalciferol (VITAMIN D) 1000 units tablet Take 1,000 Units by mouth daily.  . citalopram (CELEXA) 10 MG tablet Take 1 tablet (10 mg total) by mouth daily.  . divalproex (DEPAKOTE SPRINKLE) 125 MG capsule Take 1 cap twice a day (Patient taking differently: Take 125 mg by mouth 2 (two)  times daily. Take 1 cap twice a day)  . escitalopram (LEXAPRO) 5 MG tablet Take 5 mg by mouth daily.  . fexofenadine (ALLEGRA) 180 MG tablet Take 180 mg by mouth daily.  Marland Kitchen FLUTICASONE PROPIONATE NA Place 2 sprays into the nose daily.   Marland Kitchen guaifenesin (ROBITUSSIN) 100 MG/5ML syrup Take 200 mg by mouth 3 (three) times daily as needed for cough.  . meloxicam (MOBIC) 7.5 MG tablet Take 7.5 mg by mouth daily.  . montelukast (SINGULAIR) 10 MG tablet Take 10 mg by mouth at bedtime.  . solifenacin (VESICARE) 5 MG tablet Take 5 mg by mouth daily.  . vitamin B-12 (CYANOCOBALAMIN) 1000 MCG tablet Take 1,000 mcg by mouth daily.  . feeding supplement, ENSURE ENLIVE, (ENSURE ENLIVE) LIQD Take 237 mLs by mouth daily.   No facility-administered encounter medications on file as of 07/14/2019.     PHYSICAL EXAM:   Alert, very pleasant, slender elderly female sitting at dining room table. Unable to tell me what she had for dinner, but able to state her daughter's name.  Her conversations are off topic, though speech is clear.. She is without dyspnea;  Lung fields clear Cardiac RRR without MRG.  No contractures or LE swelling.  Neuro: grossly non-focal  Julianne Handler, NP

## 2019-08-02 ENCOUNTER — Ambulatory Visit: Payer: Medicare Other | Admitting: Neurology

## 2019-08-03 ENCOUNTER — Ambulatory Visit: Payer: Medicare Other | Admitting: Neurology

## 2019-08-21 ENCOUNTER — Emergency Department (HOSPITAL_COMMUNITY): Payer: Medicare Other

## 2019-08-21 ENCOUNTER — Encounter (HOSPITAL_COMMUNITY): Payer: Self-pay | Admitting: Emergency Medicine

## 2019-08-21 ENCOUNTER — Emergency Department (HOSPITAL_COMMUNITY)
Admission: EM | Admit: 2019-08-21 | Discharge: 2019-08-22 | Disposition: A | Discharge status: Home / Self Care | Payer: Medicare Other | Attending: Emergency Medicine | Admitting: Emergency Medicine

## 2019-08-21 ENCOUNTER — Other Ambulatory Visit: Payer: Self-pay

## 2019-08-21 DIAGNOSIS — Y92129 Unspecified place in nursing home as the place of occurrence of the external cause: Secondary | ICD-10-CM | POA: Insufficient documentation

## 2019-08-21 DIAGNOSIS — W010XXA Fall on same level from slipping, tripping and stumbling without subsequent striking against object, initial encounter: Secondary | ICD-10-CM | POA: Insufficient documentation

## 2019-08-21 DIAGNOSIS — Y9301 Activity, walking, marching and hiking: Secondary | ICD-10-CM | POA: Diagnosis not present

## 2019-08-21 DIAGNOSIS — Y999 Unspecified external cause status: Secondary | ICD-10-CM | POA: Insufficient documentation

## 2019-08-21 DIAGNOSIS — S8001XA Contusion of right knee, initial encounter: Secondary | ICD-10-CM | POA: Insufficient documentation

## 2019-08-21 DIAGNOSIS — F0391 Unspecified dementia with behavioral disturbance: Secondary | ICD-10-CM | POA: Insufficient documentation

## 2019-08-21 DIAGNOSIS — Z79899 Other long term (current) drug therapy: Secondary | ICD-10-CM | POA: Diagnosis not present

## 2019-08-21 DIAGNOSIS — W19XXXA Unspecified fall, initial encounter: Secondary | ICD-10-CM

## 2019-08-21 DIAGNOSIS — S50311A Abrasion of right elbow, initial encounter: Secondary | ICD-10-CM | POA: Insufficient documentation

## 2019-08-21 NOTE — ED Provider Notes (Signed)
[facility] DEPT Provider Note   CSN: [id number] Arrival date & time: 08/21/19  2129     History   Chief Complaint Chief Complaint  Patient presents with  . Fall  . Knee Pain    HPI Jessica Hood is a 85 y.o. female.     85 year old female with prior medical history as detailed below presents for evaluation following reported fall.  Patient had a witnessed fall at her facility.  She slipped on a wet floor.  She has an abrasion to the right elbow.  She has contusion to the right anterior knee.  She did not strike her head.  She did not pass out.  She is otherwise without complaint.  She is at her normal mental status baseline.  She is pleasantly confused at the time of my exam.     The history is provided by the patient and medical records.  Fall This is a new problem. The current episode started 1 to 2 hours ago. The problem occurs rarely. The problem has not changed since onset.Pertinent negatives include no chest pain, no abdominal pain, no headaches and no shortness of breath. Nothing aggravates the symptoms. Nothing relieves the symptoms. She has tried nothing for the symptoms.    Past Medical History:  Diagnosis Date  . Anxiety and depression   . Dementia ([facility]) 07/08/2019   Per EMS from date of 07/08/2019  . Fall 07/08/2019    Patient Active Problem List   Diagnosis Date Noted  . Moderate dementia with behavioral disturbance ([facility]) 05/11/2018    History reviewed. No pertinent surgical history.   OB History   No obstetric history on file.      Home Medications    Prior to Admission medications   Medication Sig Start Date End Date Taking? Authorizing Provider  acetaminophen (TYLENOL) 325 MG tablet Take 650 mg by mouth every 6 (six) hours as needed.   Yes [provider]  albuterol (VENTOLIN HFA) 108 (90 Base) MCG/ACT inhaler Inhale 2 puffs into the lungs every 6 (six) hours as needed for wheezing or shortness of  breath.   Yes [provider]  cholecalciferol (VITAMIN D) 1000 units tablet Take 1,000 Units by mouth daily.   Yes [provider]  citalopram (CELEXA) 10 MG tablet Take 1 tablet (10 mg total) by mouth daily. 12/14/18  Yes Cameron Sprang, MD  divalproex (DEPAKOTE SPRINKLE) 125 MG capsule Take 1 cap twice a day Patient taking differently: Take 125 mg by mouth 2 (two) times daily. Take 1 cap twice a day 12/14/18  Yes Cameron Sprang, MD  escitalopram (LEXAPRO) 5 MG tablet Take 5 mg by mouth daily.   Yes [provider]  fexofenadine (ALLEGRA) 180 MG tablet Take 180 mg by mouth daily.   Yes [provider]  FLUTICASONE PROPIONATE NA Place 2 sprays into the nose daily.    Yes [provider]  guaifenesin (ROBITUSSIN) 100 MG/5ML syrup Take 200 mg by mouth 3 (three) times daily as needed for cough.   Yes [provider]  meloxicam (MOBIC) 7.5 MG tablet Take 7.5 mg by mouth daily.   Yes [provider]  montelukast (SINGULAIR) 10 MG tablet Take 10 mg by mouth at bedtime.   Yes [provider]  solifenacin (VESICARE) 5 MG tablet Take 5 mg by mouth daily.   Yes [provider]  vitamin B-12 (CYANOCOBALAMIN) 1000 MCG tablet Take 1,000 mcg by mouth daily.   Yes [provider]    Family History History reviewed. No pertinent family history.  Social History Social History   Tobacco Use  . Smoking status: Never Smoker  . Smokeless tobacco: Never Used  Substance Use Topics  . Alcohol use: Not Currently  . Drug use: Not Currently     Allergies   Sulfa antibiotics   Review of Systems Review of Systems  Respiratory: Negative for shortness of breath.   Cardiovascular: Negative for chest pain.  Gastrointestinal: Negative for abdominal pain.  Neurological: Negative for headaches.  All other systems reviewed and are negative.    Physical Exam Updated Vital Signs BP (!) 169/75 (BP Location: Right Arm)    Pulse (!) 58   Temp 98 F (36.7 C) (Oral)   Resp 15   Ht 5\' 1"  (1.549 m)   Wt 51 kg   SpO2 98%   BMI 21.24 kg/m   Physical Exam Vitals signs and nursing note reviewed.  Constitutional:      General: She is not in acute distress.    Appearance: Normal appearance. She is well-developed.  HENT:     Head: Normocephalic and atraumatic.  Eyes:     Conjunctiva/sclera: Conjunctivae normal.     Pupils: Pupils are equal, round, and reactive to light.  Neck:     Musculoskeletal: Normal range of motion and neck supple.  Cardiovascular:     Rate and Rhythm: Normal rate and regular rhythm.     Heart sounds: Normal heart sounds.  Pulmonary:     Effort: Pulmonary effort is normal. No respiratory distress.     Breath sounds: Normal breath sounds.  Abdominal:     General: There is no distension.     Palpations: Abdomen is soft.     Tenderness: There is no abdominal tenderness.  Musculoskeletal: Normal range of motion.        General: No deformity.     Comments: Small abrasion to right elbow  Full AROM to right elbow   Skin:    General: Skin is warm and dry.  Neurological:     General: No focal deficit present.     Mental Status: She is alert and oriented to person, place, and time. Mental status is at baseline.      ED Treatments / Results  Labs (all labs ordered are listed, but only abnormal results are displayed) Labs Reviewed - No data to display  EKG None  Radiology Dg Elbow 2 Views Right  Result Date: 08/21/2019 CLINICAL DATA:  Recent fall with right elbow pain, initial encounter EXAM: RIGHT ELBOW - 2 VIEW COMPARISON:  None. FINDINGS: No acute fracture or dislocation is noted. No joint effusion is seen. No soft tissue abnormality is noted. IMPRESSION: No acute abnormality noted. Electronically Signed   By: 13/06/2019 M.D.   On: 08/21/2019 22:25   Dg Knee 2 Views Right  Result Date: 08/21/2019 CLINICAL DATA:  Recent fall with knee pain, initial encounter EXAM: RIGHT  KNEE - 2 VIEW COMPARISON:  None. FINDINGS: No acute fracture or dislocation is noted. No significant soft tissue abnormality is noted. IMPRESSION: No acute abnormality seen. Electronically Signed   By: 13/06/2019 M.D.   On: 08/21/2019 22:24   Dg Hip Unilat W Or Wo Pelvis 2-3 Views Right  Result Date: 08/21/2019 CLINICAL DATA:  Recent fall with right hip pain, initial encounter EXAM: DG HIP (WITH OR WITHOUT PELVIS) 3V RIGHT COMPARISON:  None. FINDINGS: Pelvic ring is intact. No acute fracture or dislocation is  noted. No soft tissue changes are seen. IMPRESSION: No acute abnormality noted. Electronically Signed   By: Alcide CleverMark  Lukens M.D.   On: 08/21/2019 22:24    Procedures Procedures (including critical care time)  Medications Ordered in ED Medications - No data to display   Initial Impression / Assessment and Plan / ED Course  I have reviewed the triage vital signs and the nursing notes.  Pertinent labs & imaging results that were available during my care of the patient were reviewed by me and considered in my medical decision making (see chart for details).        MDM  Screen complete  Jessica Hood was evaluated in Emergency Department on 08/21/2019 for the symptoms described in the history of present illness. She was evaluated in the context of the global COVID-19 pandemic, which necessitated consideration that the patient might be at risk for infection with the SARS-CoV-2 virus that causes COVID-19. Institutional protocols and algorithms that pertain to the evaluation of patients at risk for COVID-19 are in a state of rapid change based on information released by regulatory bodies including the CDC and federal and state organizations. These policies and algorithms were followed during the patient's care in the ED.  Patient is presenting for evaluation following reported fall.  Patient without evidence of significant traumatic injury from her fall.  Screening plain films are  without significant abnormality.  Patient appears to be appropriate for discharge.  Importance of close follow-up was stressed.  Strict return precautions given and understood.   Final Clinical Impressions(s) / ED Diagnoses   Final diagnoses:  Fall, initial encounter    ED Discharge Orders    None       Wynetta FinesMessick,  C, MD 08/21/19 2231

## 2019-08-21 NOTE — ED Notes (Signed)
[facility] called for transport.  

## 2019-08-21 NOTE — Discharge Instructions (Addendum)
Please return for any problem.  Follow-up with your regular care provider as instructed. °

## 2019-08-21 NOTE — ED Triage Notes (Signed)
Pt arrived via EMS. Pt had a witnessed fall at her facility. Pt has an abrasion on her R knee and on her R elbow. Pt has hx of dementia. Pt may have been incontinent per EMS. Pt states that her neck hurts

## 2019-10-14 ENCOUNTER — Inpatient Hospital Stay (HOSPITAL_COMMUNITY): Payer: Medicare Other

## 2019-10-14 ENCOUNTER — Encounter (HOSPITAL_COMMUNITY): Payer: Self-pay

## 2019-10-14 ENCOUNTER — Emergency Department (HOSPITAL_COMMUNITY): Payer: Medicare Other

## 2019-10-14 ENCOUNTER — Other Ambulatory Visit: Payer: Self-pay

## 2019-10-14 ENCOUNTER — Inpatient Hospital Stay (HOSPITAL_COMMUNITY)
Admission: EM | Admit: 2019-10-14 | Discharge: 2019-11-13 | DRG: 177 | Disposition: E | Payer: Medicare Other | Attending: Internal Medicine | Admitting: Internal Medicine

## 2019-10-14 ENCOUNTER — Other Ambulatory Visit (HOSPITAL_COMMUNITY): Payer: Medicare Other

## 2019-10-14 DIAGNOSIS — R0609 Other forms of dyspnea: Secondary | ICD-10-CM | POA: Diagnosis not present

## 2019-10-14 DIAGNOSIS — Z66 Do not resuscitate: Secondary | ICD-10-CM | POA: Diagnosis present

## 2019-10-14 DIAGNOSIS — N179 Acute kidney failure, unspecified: Secondary | ICD-10-CM | POA: Diagnosis present

## 2019-10-14 DIAGNOSIS — A419 Sepsis, unspecified organism: Secondary | ICD-10-CM

## 2019-10-14 DIAGNOSIS — E860 Dehydration: Secondary | ICD-10-CM | POA: Diagnosis present

## 2019-10-14 DIAGNOSIS — J1282 Pneumonia due to coronavirus disease 2019: Secondary | ICD-10-CM | POA: Diagnosis present

## 2019-10-14 DIAGNOSIS — J9601 Acute respiratory failure with hypoxia: Secondary | ICD-10-CM | POA: Diagnosis present

## 2019-10-14 DIAGNOSIS — F0390 Unspecified dementia without behavioral disturbance: Secondary | ICD-10-CM | POA: Diagnosis present

## 2019-10-14 DIAGNOSIS — I10 Essential (primary) hypertension: Secondary | ICD-10-CM | POA: Diagnosis present

## 2019-10-14 DIAGNOSIS — R001 Bradycardia, unspecified: Secondary | ICD-10-CM | POA: Diagnosis not present

## 2019-10-14 DIAGNOSIS — R0902 Hypoxemia: Secondary | ICD-10-CM

## 2019-10-14 DIAGNOSIS — F419 Anxiety disorder, unspecified: Secondary | ICD-10-CM | POA: Diagnosis present

## 2019-10-14 DIAGNOSIS — I361 Nonrheumatic tricuspid (valve) insufficiency: Secondary | ICD-10-CM | POA: Diagnosis not present

## 2019-10-14 DIAGNOSIS — U071 COVID-19: Principal | ICD-10-CM | POA: Diagnosis present

## 2019-10-14 DIAGNOSIS — Z882 Allergy status to sulfonamides status: Secondary | ICD-10-CM | POA: Diagnosis not present

## 2019-10-14 DIAGNOSIS — R0600 Dyspnea, unspecified: Secondary | ICD-10-CM

## 2019-10-14 DIAGNOSIS — F329 Major depressive disorder, single episode, unspecified: Secondary | ICD-10-CM | POA: Diagnosis present

## 2019-10-14 DIAGNOSIS — E872 Acidosis: Secondary | ICD-10-CM | POA: Diagnosis present

## 2019-10-14 DIAGNOSIS — Z79899 Other long term (current) drug therapy: Secondary | ICD-10-CM

## 2019-10-14 DIAGNOSIS — A4189 Other specified sepsis: Secondary | ICD-10-CM | POA: Diagnosis present

## 2019-10-14 DIAGNOSIS — Z515 Encounter for palliative care: Secondary | ICD-10-CM | POA: Diagnosis present

## 2019-10-14 DIAGNOSIS — R600 Localized edema: Secondary | ICD-10-CM | POA: Diagnosis present

## 2019-10-14 LAB — CBC WITH DIFFERENTIAL/PLATELET
Abs Immature Granulocytes: 0.16 10*3/uL — ABNORMAL HIGH (ref 0.00–0.07)
Basophils Absolute: 0 10*3/uL (ref 0.0–0.1)
Basophils Relative: 0 %
Eosinophils Absolute: 0 10*3/uL (ref 0.0–0.5)
Eosinophils Relative: 0 %
HCT: 37.8 % (ref 36.0–46.0)
Hemoglobin: 12.1 g/dL (ref 12.0–15.0)
Immature Granulocytes: 2 %
Lymphocytes Relative: 10 %
Lymphs Abs: 0.8 10*3/uL (ref 0.7–4.0)
MCH: 31.9 pg (ref 26.0–34.0)
MCHC: 32 g/dL (ref 30.0–36.0)
MCV: 99.7 fL (ref 80.0–100.0)
Monocytes Absolute: 0.4 10*3/uL (ref 0.1–1.0)
Monocytes Relative: 5 %
Neutro Abs: 7.3 10*3/uL (ref 1.7–7.7)
Neutrophils Relative %: 83 %
Platelets: 233 10*3/uL (ref 150–400)
RBC: 3.79 MIL/uL — ABNORMAL LOW (ref 3.87–5.11)
RDW: 13.2 % (ref 11.5–15.5)
WBC: 8.7 10*3/uL (ref 4.0–10.5)
nRBC: 0 % (ref 0.0–0.2)

## 2019-10-14 LAB — POC SARS CORONAVIRUS 2 AG -  ED: SARS Coronavirus 2 Ag: POSITIVE — AB

## 2019-10-14 LAB — COMPREHENSIVE METABOLIC PANEL
ALT: 32 U/L (ref 0–44)
AST: 64 U/L — ABNORMAL HIGH (ref 15–41)
Albumin: 3.3 g/dL — ABNORMAL LOW (ref 3.5–5.0)
Alkaline Phosphatase: 48 U/L (ref 38–126)
Anion gap: 10 (ref 5–15)
BUN: 39 mg/dL — ABNORMAL HIGH (ref 8–23)
CO2: 29 mmol/L (ref 22–32)
Calcium: 8.9 mg/dL (ref 8.9–10.3)
Chloride: 105 mmol/L (ref 98–111)
Creatinine, Ser: 1.19 mg/dL — ABNORMAL HIGH (ref 0.44–1.00)
GFR calc Af Amer: 47 mL/min — ABNORMAL LOW (ref >60)
GFR calc non Af Amer: 40 mL/min — ABNORMAL LOW (ref >60)
Glucose, Bld: 116 mg/dL — ABNORMAL HIGH (ref 70–99)
Potassium: 3.7 mmol/L (ref 3.5–5.1)
Sodium: 144 mmol/L (ref 135–145)
Total Bilirubin: 1.2 mg/dL (ref 0.3–1.2)
Total Protein: 7.2 g/dL (ref 6.5–8.1)

## 2019-10-14 LAB — TRIGLYCERIDES: Triglycerides: 136 mg/dL (ref <150)

## 2019-10-14 LAB — ECHOCARDIOGRAM LIMITED

## 2019-10-14 LAB — D-DIMER, QUANTITATIVE: D-Dimer, Quant: 1.45 ug/mL-FEU — ABNORMAL HIGH (ref 0.00–0.50)

## 2019-10-14 LAB — C-REACTIVE PROTEIN: CRP: 12.3 mg/dL — ABNORMAL HIGH (ref <1.0)

## 2019-10-14 LAB — FIBRINOGEN: Fibrinogen: 594 mg/dL — ABNORMAL HIGH (ref 210–475)

## 2019-10-14 LAB — LACTIC ACID, PLASMA
Lactic Acid, Venous: 2.7 mmol/L (ref 0.5–1.9)
Lactic Acid, Venous: 1.3 mmol/L (ref 0.5–1.9)

## 2019-10-14 LAB — FERRITIN: Ferritin: 1248 ng/mL — ABNORMAL HIGH (ref 11–307)

## 2019-10-14 LAB — ABO/RH: ABO/RH(D): A POS

## 2019-10-14 LAB — LACTATE DEHYDROGENASE: LDH: 466 U/L — ABNORMAL HIGH (ref 98–192)

## 2019-10-14 LAB — PROCALCITONIN: Procalcitonin: 0.16 ng/mL

## 2019-10-14 MED ORDER — CITALOPRAM HYDROBROMIDE 10 MG PO TABS: 10.0000 mg | ORAL_TABLET | Freq: Every day | ORAL | Status: DC

## 2019-10-14 MED ORDER — MONTELUKAST SODIUM 10 MG PO TABS: 10.0000 mg | ORAL_TABLET | Freq: Every day | ORAL | Status: DC

## 2019-10-14 MED ORDER — HYDRALAZINE HCL 10 MG PO TABS
10.0000 mg | ORAL_TABLET | Freq: Four times a day (QID) | ORAL | Status: DC | PRN
  Filled 2019-10-14: qty 1

## 2019-10-14 MED ORDER — DARIFENACIN HYDROBROMIDE ER 7.5 MG PO TB24: 7.5000 mg | ORAL_TABLET | Freq: Every day | ORAL | Status: DC

## 2019-10-14 MED ORDER — ZINC SULFATE 220 (50 ZN) MG PO CAPS
220.0000 mg | ORAL_CAPSULE | Freq: Every day | ORAL | Status: DC
  Filled 2019-10-14: qty 1

## 2019-10-14 MED ORDER — ASCORBIC ACID 500 MG PO TABS
500.0000 mg | ORAL_TABLET | Freq: Every day | ORAL | Status: DC
  Filled 2019-10-14: qty 1

## 2019-10-14 MED ORDER — SODIUM CHLORIDE 0.9 % IV SOLN
200.0000 mg | Freq: Once | INTRAVENOUS | Status: AC
  Administered 2019-10-14: 200 mg via INTRAVENOUS
  Filled 2019-10-14: qty 40

## 2019-10-14 MED ORDER — ACETAMINOPHEN 650 MG RE SUPP: 650.0000 mg | RECTAL | Status: DC | PRN

## 2019-10-14 MED ORDER — GUAIFENESIN 100 MG/5ML PO SYRP: 200.0000 mg | ORAL_SOLUTION | Freq: Three times a day (TID) | ORAL | Status: DC | PRN

## 2019-10-14 MED ORDER — LORATADINE 10 MG PO TABS: 10.0000 mg | ORAL_TABLET | Freq: Every day | ORAL | Status: DC

## 2019-10-14 MED ORDER — METHYLPREDNISOLONE SODIUM SUCC 40 MG IJ SOLR: 40.0000 mg | Freq: Every day | INTRAMUSCULAR | Status: DC

## 2019-10-14 MED ORDER — DIVALPROEX SODIUM 125 MG PO CSDR: 125.0000 mg | DELAYED_RELEASE_CAPSULE | Freq: Two times a day (BID) | ORAL | Status: DC

## 2019-10-14 MED ORDER — ALBUMIN HUMAN 25 % IV SOLN
12.5000 g | Freq: Four times a day (QID) | INTRAVENOUS | Status: DC
  Administered 2019-10-14 – 2019-10-15 (×3): 12.5 g via INTRAVENOUS
  Filled 2019-10-14 (×5): qty 50

## 2019-10-14 MED ORDER — ACETAMINOPHEN 325 MG PO TABS: 650.0000 mg | ORAL_TABLET | Freq: Four times a day (QID) | ORAL | Status: DC | PRN

## 2019-10-14 MED ORDER — HEPARIN SODIUM (PORCINE) 5000 UNIT/ML IJ SOLN
5000.0000 [IU] | Freq: Three times a day (TID) | INTRAMUSCULAR | Status: DC
  Administered 2019-10-14 – 2019-10-17 (×9): 5000 [IU] via SUBCUTANEOUS
  Filled 2019-10-14 (×9): qty 1

## 2019-10-14 MED ORDER — ESCITALOPRAM OXALATE 10 MG PO TABS: 5.0000 mg | ORAL_TABLET | Freq: Every day | ORAL | Status: DC

## 2019-10-14 MED ORDER — DEXAMETHASONE SODIUM PHOSPHATE 10 MG/ML IJ SOLN
10.0000 mg | Freq: Once | INTRAMUSCULAR | Status: AC
  Administered 2019-10-14: 10 mg via INTRAVENOUS
  Filled 2019-10-14: qty 1

## 2019-10-14 MED ORDER — VITAMIN D3 25 MCG PO TABS: 1000.0000 [IU] | ORAL_TABLET | Freq: Every day | ORAL | Status: DC

## 2019-10-14 MED ORDER — SODIUM CHLORIDE 0.9 % IV BOLUS
250.0000 mL | Freq: Once | INTRAVENOUS | Status: AC
  Administered 2019-10-14: 250 mL via INTRAVENOUS

## 2019-10-14 MED ORDER — SODIUM CHLORIDE 0.9 % IV SOLN
100.0000 mg | Freq: Every day | INTRAVENOUS | Status: DC
  Administered 2019-10-15 – 2019-10-16 (×2): 100 mg via INTRAVENOUS
  Filled 2019-10-14 (×3): qty 20

## 2019-10-14 MED ORDER — DEXAMETHASONE 4 MG PO TABS: 6.0000 mg | ORAL_TABLET | Freq: Every day | ORAL | Status: DC

## 2019-10-14 MED ORDER — FLUTICASONE PROPIONATE 50 MCG/ACT NA SUSP
1.0000 | Freq: Every day | NASAL | Status: DC
  Filled 2019-10-14: qty 16

## 2019-10-14 MED ORDER — ALBUTEROL SULFATE HFA 108 (90 BASE) MCG/ACT IN AERS
2.0000 | INHALATION_SPRAY | Freq: Four times a day (QID) | RESPIRATORY_TRACT | Status: DC | PRN
  Administered 2019-10-15: 2 via RESPIRATORY_TRACT
  Filled 2019-10-14: qty 6.7

## 2019-10-14 MED ORDER — SENNOSIDES-DOCUSATE SODIUM 8.6-50 MG PO TABS: 1.0000 | ORAL_TABLET | Freq: Every evening | ORAL | Status: DC | PRN

## 2019-10-14 MED ORDER — MELOXICAM 7.5 MG PO TABS: 7.5000 mg | ORAL_TABLET | Freq: Every day | ORAL | Status: DC

## 2019-10-14 NOTE — ED Notes (Signed)
Updated pt's daughter on condition

## 2019-10-14 NOTE — ED Triage Notes (Signed)
Pt from [facility] for fever, sob and body aches, unknown COVID status but facility has COVID positive residents. Pt alert, disoriented per baseline, 92% on 4L [place] applied by EMS, pt does not wear oxygen at facility. Other VSS

## 2019-10-14 NOTE — ED Provider Notes (Signed)
[facility] EMERGENCY DEPARTMENT Provider Note   CSN: [id number] Arrival date & time: 10-18-2019  0945     History Chief Complaint  Patient presents with  . Shortness of Breath  . Fever    Jessica Hood is a 86 y.o. female.  LEVEL 5 CAVEAT DUE TO DEMENTIA, tested positive for COVID this past week per family.  Had low oxygen and fever at facility today.  Patient unable to provide much history due to dementia.  Patient has no chronic medical problems per family.  Unknown what patient's CODE STATUS is.  However, family will talk to power of attorney to figure that out.   The history is provided by the patient.  Shortness of Breath Severity:  Moderate Onset quality:  Gradual Duration:  2 days Progression:  Worsening Chronicity:  New Context: URI   Relieved by:  Nothing Worsened by:  Nothing Associated symptoms: fever        Past Medical History:  Diagnosis Date  . Anxiety and depression   . Dementia (HCC) 07/08/2019   Per EMS from date of 07/08/2019  . Fall 07/08/2019    Patient Active Problem List   Diagnosis Date Noted  . Moderate dementia with behavioral disturbance (HCC) 05/11/2018    History reviewed. No pertinent surgical history.   OB History   No obstetric history on file.     No family history on file.  Social History   Tobacco Use  . Smoking status: Never Smoker  . Smokeless tobacco: Never Used  Substance Use Topics  . Alcohol use: Not Currently  . Drug use: Not Currently    Home Medications Prior to Admission medications   Medication Sig Start Date End Date Taking? Authorizing Provider  acetaminophen (TYLENOL) 325 MG tablet Take 650 mg by mouth every 6 (six) hours as needed.    [provider]  albuterol (VENTOLIN HFA) 108 (90 Base) MCG/ACT inhaler Inhale 2 puffs into the lungs every 6 (six) hours as needed for wheezing or shortness of breath.    [provider]  cholecalciferol (VITAMIN D) 1000 units  tablet Take 1,000 Units by mouth daily.    [provider]  citalopram (CELEXA) 10 MG tablet Take 1 tablet (10 mg total) by mouth daily. 12/14/18   Van Clines, MD  divalproex (DEPAKOTE SPRINKLE) 125 MG capsule Take 1 cap twice a day Patient taking differently: Take 125 mg by mouth 2 (two) times daily. Take 1 cap twice a day 12/14/18   Van Clines, MD  escitalopram (LEXAPRO) 5 MG tablet Take 5 mg by mouth daily.    [provider]  fexofenadine (ALLEGRA) 180 MG tablet Take 180 mg by mouth daily.    [provider]  FLUTICASONE PROPIONATE NA Place 2 sprays into the nose daily.     [provider]  guaifenesin (ROBITUSSIN) 100 MG/5ML syrup Take 200 mg by mouth 3 (three) times daily as needed for cough.    [provider]  meloxicam (MOBIC) 7.5 MG tablet Take 7.5 mg by mouth daily.    [provider]  montelukast (SINGULAIR) 10 MG tablet Take 10 mg by mouth at bedtime.    [provider]  solifenacin (VESICARE) 5 MG tablet Take 5 mg by mouth daily.    [provider]  vitamin B-12 (CYANOCOBALAMIN) 1000 MCG tablet Take 1,000 mcg by mouth daily.    [provider]    Allergies    Sulfa antibiotics  Review of  Systems   Review of Systems  Unable to perform ROS: Dementia  Constitutional: Positive for fever.  Respiratory: Positive for shortness of breath.     Physical Exam Updated Vital Signs  ED Triage Vitals [2019/11/13 0948]  Enc Vitals Group     BP (!) 156/79     Pulse Rate (!) 51     Resp 19     Temp 98.4 F (36.9 C)     Temp Source Oral     SpO2 97 %     Weight      Height      Head Circumference      Peak Flow      Pain Score      Pain Loc      Pain Edu?      Excl. in GC?     Physical Exam Vitals and nursing note reviewed.  Constitutional:      General: She is not in acute distress.    Appearance: She is well-developed. She is not ill-appearing.  HENT:     Head: Normocephalic and  atraumatic.     Mouth/Throat:     Mouth: Mucous membranes are moist.  Eyes:     Conjunctiva/sclera: Conjunctivae normal.     Pupils: Pupils are equal, round, and reactive to light.  Cardiovascular:     Rate and Rhythm: Normal rate and regular rhythm.     Pulses: Normal pulses.     Heart sounds: Normal heart sounds. No murmur.  Pulmonary:     Effort: Tachypnea present. No respiratory distress.     Breath sounds: Decreased breath sounds present.     Comments: Poor respiratory effort Abdominal:     Palpations: Abdomen is soft.     Tenderness: There is no abdominal tenderness.  Musculoskeletal:        General: Normal range of motion.     Cervical back: Neck supple.     Right lower leg: No edema.     Left lower leg: No edema.  Skin:    General: Skin is warm and dry.     Capillary Refill: Capillary refill takes less than 2 seconds.  Neurological:     General: No focal deficit present.     Mental Status: She is alert.     Comments: Overall pleasantly demented, unable to fully participate in exam but does track well in the room  Psychiatric:        Mood and Affect: Mood normal.     ED Results / Procedures / Treatments   Labs (all labs ordered are listed, but only abnormal results are displayed) Labs Reviewed  LACTIC ACID, PLASMA - Abnormal; Notable for the following components:      Result Value   Lactic Acid, Venous 2.7 (*)    All other components within normal limits  CBC WITH DIFFERENTIAL/PLATELET - Abnormal; Notable for the following components:   RBC 3.79 (*)    Abs Immature Granulocytes 0.16 (*)    All other components within normal limits  COMPREHENSIVE METABOLIC PANEL - Abnormal; Notable for the following components:   Glucose, Bld 116 (*)    BUN 39 (*)    Creatinine, Ser 1.19 (*)    Albumin 3.3 (*)    AST 64 (*)    GFR calc non Af Amer 40 (*)    GFR calc Af Amer 47 (*)    All other components within normal limits  D-DIMER, QUANTITATIVE (NOT AT [facility]) - Abnormal;  Notable for the following  components:   D-Dimer, Quant 1.45 (*)    All other components within normal limits  LACTATE DEHYDROGENASE - Abnormal; Notable for the following components:   LDH 466 (*)    All other components within normal limits  FERRITIN - Abnormal; Notable for the following components:   Ferritin 1,248 (*)    All other components within normal limits  FIBRINOGEN - Abnormal; Notable for the following components:   Fibrinogen 594 (*)    All other components within normal limits  C-REACTIVE PROTEIN - Abnormal; Notable for the following components:   CRP 12.3 (*)    All other components within normal limits  POC SARS CORONAVIRUS 2 AG -  ED - Abnormal; Notable for the following components:   SARS Coronavirus 2 Ag POSITIVE (*)    All other components within normal limits  CULTURE, BLOOD (ROUTINE X 2)  CULTURE, BLOOD (ROUTINE X 2)  TRIGLYCERIDES  LACTIC ACID, PLASMA  PROCALCITONIN    EKG EKG Interpretation  Date/Time:  Saturday October 14 2019 10:36:04 EST Ventricular Rate:  53 PR Interval:    QRS Duration: 114 QT Interval:  486 QTC Calculation: 457 R Axis:   21 Text Interpretation: Sinus rhythm Ventricular premature complex Borderline intraventricular conduction delay Confirmed by Lennice Sites [phone]) on 10/24/2019 11:12:04 AM   Radiology DG Chest Port 1 View  Result Date: 10/24/2019 CLINICAL DATA:  Shortness of breath. Additional history provided: Fever, shortness of breath and body aches, unknown COVID status. EXAM: PORTABLE CHEST 1 VIEW COMPARISON:  No pertinent prior studies available for comparison. FINDINGS: Heart size at the upper limits of normal. Patchy airspace opacity throughout the left lung and at the right lung base. No evidence of pleural effusion or pneumothorax. No acute bony abnormality. Overlying cardiac monitoring leads. IMPRESSION: Patchy airspace opacity throughout the left lung and within the right lung base, likely reflecting multifocal  pneumonia given provided history. Radiographic follow-up to resolution recommended. Electronically Signed   By: Kellie Simmering DO   On: 10/22/2019 10:50    Procedures .Critical Care Performed by: Lennice Sites, DO Authorized by: Lennice Sites, DO   Critical care provider statement:    Critical care time (minutes):  35   Critical care was necessary to treat or prevent imminent or life-threatening deterioration of the following conditions:  Sepsis and respiratory failure   Critical care was time spent personally by me on the following activities:  Blood draw for specimens, development of treatment plan with patient or surrogate, discussions with primary provider, examination of patient, evaluation of patient's response to treatment, obtaining history from patient or surrogate, ordering and performing treatments and interventions, ordering and review of laboratory studies, pulse oximetry, ordering and review of radiographic studies, re-evaluation of patient's condition and review of old charts   (including critical care time)  Medications Ordered in ED Medications  dexamethasone (DECADRON) injection 10 mg (10 mg Intravenous Given 10/16/2019 1143)  sodium chloride 0.9 % bolus 250 mL (250 mLs Intravenous New Bag/Given 11/05/2019 1158)    ED Course  I have reviewed the triage vital signs and the nursing notes.  Pertinent labs & imaging results that were available during my care of the patient were reviewed by me and considered in my medical decision making (see chart for details).    MDM Rules/Calculators/A&P   NATISHA TRZCINSKI is an 86 year old female with history of severe dementia who presents to the ED with shortness of breath, fever.  Tested positive for coronavirus at her facility this past week.  Got hypoxic at her facility and was brought here.  On room air she drops down to 90-91%.  She was placed on 2 L of oxygen.  She does have increased work of breathing.  Otherwise normal vitals.  No  fever.  Overall she appears to be at her mental baseline where she appears to track but she is overall nonverbal.  Family is aware that she is in the hospital.  I spoke to the patient's daughter who will talk to her brother who is the power of attorney about CODE STATUS.  However at this time she appears stable.  We will get Covid screening labs and anticipate admission given abnormal vitals and increased work of breathing.  Chest x-ray consistent with multifocal pneumonia likely Covid.  Patient given Decadron.  Antibiotics held at this time.  Awaiting labs and reevaluation.  Patient with mild lactic acidosis.  Inflammatory markers also elevated.  Will admit to the hospitalist service given hypoxia, increased work of breathing and Covid diagnosis.  Patient is DNR.  Try to arrange for possible treatment at her home facility but the logistics appear difficult at this time.  Will admit for further treatment and arrangement of care.  This chart was dictated using voice recognition software.  Despite best efforts to proofread,  errors can occur which can change the documentation meaning.  Jessica Hood was evaluated in Emergency Department on November 13, 2019 for the symptoms described in the history of present illness. She was evaluated in the context of the global COVID-19 pandemic, which necessitated consideration that the patient might be at risk for infection with the SARS-CoV-2 virus that causes COVID-19. Institutional protocols and algorithms that pertain to the evaluation of patients at risk for COVID-19 are in a state of rapid change based on information released by regulatory bodies including the CDC and federal and state organizations. These policies and algorithms were followed during the patient's care in the ED.   Final Clinical Impression(s) / ED Diagnoses Final diagnoses:  COVID-19  Acute respiratory failure with hypoxia (HCC)  Sepsis, due to unspecified organism, unspecified whether acute organ  dysfunction present [facility])    Rx / DC Orders ED Discharge Orders    None       Virgina Norfolk, DO Nov 13, 2019 1211

## 2019-10-14 NOTE — H&P (Addendum)
History and Physical    Jessica Hood:[id number] DOB: 02/06/1928 DOA: 11/08/2019  PCP: Cristina Gong, NP   Patient coming from: [facility]  I have personally briefly reviewed patient's old medical records in [facility]  Chief Complaint: Fever and SOB  HPI: Jessica Hood is a 86 y.o. female NH resident with medical history significant of advanced dementia, presented with Covid infection increasing short of breath and fever at nursing home.  According to nursing home staff, there are was a outbreak recently in the nursing home with numbers of residents there affected, patient started to have cough earlier this week and then was tested positive for Covid infection.  Patient was rather stable without fever and hypoxia at that time, so was kept in house for monitoring and did not received any medication for the COVID infection.  Today patient developed new hypoxia and increasing short of breath, nursing home staff decided to send patient to the ER. ED Course: Patient found to be easily desat, X-ray showed multifocal pneumonia. ED physician talked to nursing home staff regarding whether remdesivir and steroid can be used in nursing home, nursing home staff reported the remdesivir infusion cannot be accommodated on-site at nursing home.  Review of Systems: Unable to perform, pt demented   Past Medical History:  Diagnosis Date  . Anxiety and depression   . Dementia (HCC) 07/08/2019   Per EMS from date of 07/08/2019  . Fall 07/08/2019    History reviewed. No pertinent surgical history.   reports that she has never smoked. She has never used smokeless tobacco. She reports previous alcohol use. She reports previous drug use.  Allergies  Allergen Reactions  . Sulfa Antibiotics     No family history on file.   Prior to Admission medications   Medication Sig Start Date End Date Taking? Authorizing Provider  acetaminophen (TYLENOL) 325 MG tablet Take 650 mg by mouth every 6  (six) hours as needed.    [provider]  albuterol (VENTOLIN HFA) 108 (90 Base) MCG/ACT inhaler Inhale 2 puffs into the lungs every 6 (six) hours as needed for wheezing or shortness of breath.    [provider]  cholecalciferol (VITAMIN D) 1000 units tablet Take 1,000 Units by mouth daily.    [provider]  citalopram (CELEXA) 10 MG tablet Take 1 tablet (10 mg total) by mouth daily. 12/14/18   Van Clines, MD  divalproex (DEPAKOTE SPRINKLE) 125 MG capsule Take 1 cap twice a day Patient taking differently: Take 125 mg by mouth 2 (two) times daily. Take 1 cap twice a day 12/14/18   Van Clines, MD  escitalopram (LEXAPRO) 5 MG tablet Take 5 mg by mouth daily.    [provider]  fexofenadine (ALLEGRA) 180 MG tablet Take 180 mg by mouth daily.    [provider]  FLUTICASONE PROPIONATE NA Place 2 sprays into the nose daily.     [provider]  guaifenesin (ROBITUSSIN) 100 MG/5ML syrup Take 200 mg by mouth 3 (three) times daily as needed for cough.    [provider]  meloxicam (MOBIC) 7.5 MG tablet Take 7.5 mg by mouth daily.    [provider]  montelukast (SINGULAIR) 10 MG tablet Take 10 mg by mouth at bedtime.    [provider]  solifenacin (VESICARE) 5 MG tablet Take 5 mg by mouth daily.    [provider]  vitamin B-12 (CYANOCOBALAMIN) 1000 MCG tablet Take 1,000 mcg by mouth  daily.    [provider]    Physical Exam: Vitals:   11/02/2019 1033 11/12/2019 1100 10/24/2019 1145 10/31/2019 1200  BP:  (!) 158/62  (!) 170/67  Pulse:  (!) 49 (!) 49   Resp:  (!) 29 (!) 25 (!) 22  Temp:      TempSrc:      SpO2: 94% 98% 95%     Constitutional: NAD, calm, comfortable Vitals:   10/16/2019 1033 10/21/2019 1100 11/10/2019 1145 11/08/2019 1200  BP:  (!) 158/62  (!) 170/67  Pulse:  (!) 49 (!) 49   Resp:  (!) 29 (!) 25 (!) 22  Temp:      TempSrc:      SpO2: 94% 98% 95%    Eyes: PERRL, lids and  conjunctivae normal ENMT: Mucous membranes are dry. Posterior pharynx clear of any exudate or lesions.Normal dentition.  Neck: normal, supple, no masses, no thyromegaly Respiratory: clear to auscultation bilaterally shallow, no wheezing, no crackles. increasing respiratory effort. No accessory muscle use.  Cardiovascular: Regular rate and rhythm, no murmurs / rubs / gallops. 1+ extremity edema mainly on calfs. 2+ pedal pulses. No carotid bruits.  Abdomen: no tenderness, no masses palpated. No hepatosplenomegaly. Bowel sounds positive.  Musculoskeletal: no clubbing / cyanosis. No joint deformity upper and lower extremities. Good ROM, no contractures. Normal muscle tone.  Skin: no rashes, lesions, ulcers. No induration Neurologic: moving all limbs Psychiatric: Oriented to herself. Normal mood.    Labs on Admission: I have personally reviewed following labs and imaging studies  CBC: Recent Labs  Lab 11/09/2019 1045  WBC 8.7  NEUTROABS 7.3  HGB 12.1  HCT 37.8  MCV 99.7  PLT 564   Basic Metabolic Panel: Recent Labs  Lab 11/06/2019 1045  NA 144  K 3.7  CL 105  CO2 29  GLUCOSE 116*  BUN 39*  CREATININE 1.19*  CALCIUM 8.9   GFR: CrCl cannot be calculated (Unknown ideal weight.). Liver Function Tests: Recent Labs  Lab 10/27/2019 1045  AST 64*  ALT 32  ALKPHOS 48  BILITOT 1.2  PROT 7.2  ALBUMIN 3.3*   No results for input(s): LIPASE, AMYLASE in the last 168 hours. No results for input(s): AMMONIA in the last 168 hours. Coagulation Profile: No results for input(s): INR, PROTIME in the last 168 hours. Cardiac Enzymes: No results for input(s): CKTOTAL, CKMB, CKMBINDEX, TROPONINI in the last 168 hours. BNP (last 3 results) No results for input(s): PROBNP in the last 8760 hours. HbA1C: No results for input(s): HGBA1C in the last 72 hours. CBG: No results for input(s): GLUCAP in the last 168 hours. Lipid Profile: Recent Labs    10/13/2019 1045  TRIG 136   Thyroid Function  Tests: No results for input(s): TSH, T4TOTAL, FREET4, T3FREE, THYROIDAB in the last 72 hours. Anemia Panel: Recent Labs    11/09/2019 1045  FERRITIN 1,248*   Urine analysis: No results found for: COLORURINE, APPEARANCEUR, LABSPEC, PHURINE, GLUCOSEU, HGBUR, BILIRUBINUR, KETONESUR, PROTEINUR, UROBILINOGEN, NITRITE, LEUKOCYTESUR  Radiological Exams on Admission: DG Chest Port 1 View  Result Date: 10/31/2019 CLINICAL DATA:  Shortness of breath. Additional history provided: Fever, shortness of breath and body aches, unknown COVID status. EXAM: PORTABLE CHEST 1 VIEW COMPARISON:  No pertinent prior studies available for comparison. FINDINGS: Heart size at the upper limits of normal. Patchy airspace opacity throughout the left lung and at the right lung base. No evidence of pleural effusion or pneumothorax. No acute bony abnormality. Overlying cardiac monitoring leads. IMPRESSION: Patchy airspace  opacity throughout the left lung and within the right lung base, likely reflecting multifocal pneumonia given provided history. Radiographic follow-up to resolution recommended. Electronically Signed   By: Jackey Loge DO   On: 2019-10-18 10:50    EKG: Independently reviewed.  Assessment/Plan Active Problems:   COVID-19 virus infection   COVID-19  COVID 19 pneumonia, with fever and frequent desaturation, start remdesivir and steroid, colloid for hydration, avoid crystalloid.  Patient family agreed with DNR.  Supportive care such as breathing meds, proning as tolerated.  AKI, probably from dehydration poor oral intake, albumin for hydration.  Peripheral edema, check echo.  Elevated BP without diagnosis of hypertension, hydralazine as needed PO.  Sinus bradycardia, avoid beat blocker.  Advanced dementia, continue home meds, avoid benzo and oversedation.   DVT prophylaxis: Heparin subcu Code Status: DNR Family Communication: Daughter Disposition Plan: Physical therapy to follow Consults called:  None Admission status: Telemetry admission   Emeline General MD [facility] Pager [phone]  If 7PM-7AM, please contact night-coverage [email or web address] Password [email or web address]  Oct 18, 2019, 1:29 PM

## 2019-10-14 NOTE — ED Notes (Signed)
Pt is sinus brady on monitor 

## 2019-10-14 NOTE — ED Notes (Signed)
PureWick applied to pt. 

## 2019-10-14 NOTE — Progress Notes (Signed)
Failed swallow evaluation, speech recommend strict NPO and hold all PO meds. Aspiration precaution.

## 2019-10-14 NOTE — Progress Notes (Signed)
  Echocardiogram 2D Echocardiogram has been performed.  Jessica Hood November 04, 2019, 6:17 PM

## 2019-10-15 ENCOUNTER — Inpatient Hospital Stay (HOSPITAL_COMMUNITY): Payer: Medicare Other

## 2019-10-15 LAB — CBC WITH DIFFERENTIAL/PLATELET
Abs Immature Granulocytes: 0.22 10*3/uL — ABNORMAL HIGH (ref 0.00–0.07)
Basophils Absolute: 0 10*3/uL (ref 0.0–0.1)
Basophils Relative: 0 %
Eosinophils Absolute: 0 10*3/uL (ref 0.0–0.5)
Eosinophils Relative: 0 %
HCT: 31.4 % — ABNORMAL LOW (ref 36.0–46.0)
Hemoglobin: 10.1 g/dL — ABNORMAL LOW (ref 12.0–15.0)
Immature Granulocytes: 2 %
Lymphocytes Relative: 8 %
Lymphs Abs: 0.8 10*3/uL (ref 0.7–4.0)
MCH: 31.6 pg (ref 26.0–34.0)
MCHC: 32.2 g/dL (ref 30.0–36.0)
MCV: 98.1 fL (ref 80.0–100.0)
Monocytes Absolute: 0.4 10*3/uL (ref 0.1–1.0)
Monocytes Relative: 4 %
Neutro Abs: 8.4 10*3/uL — ABNORMAL HIGH (ref 1.7–7.7)
Neutrophils Relative %: 86 %
Platelets: 211 10*3/uL (ref 150–400)
RBC: 3.2 MIL/uL — ABNORMAL LOW (ref 3.87–5.11)
RDW: 13.1 % (ref 11.5–15.5)
WBC: 9.9 10*3/uL (ref 4.0–10.5)
nRBC: 0 % (ref 0.0–0.2)

## 2019-10-15 LAB — COMPREHENSIVE METABOLIC PANEL
ALT: 23 U/L (ref 0–44)
AST: 34 U/L (ref 15–41)
Albumin: 2.8 g/dL — ABNORMAL LOW (ref 3.5–5.0)
Alkaline Phosphatase: 33 U/L — ABNORMAL LOW (ref 38–126)
Anion gap: 12 (ref 5–15)
BUN: 43 mg/dL — ABNORMAL HIGH (ref 8–23)
CO2: 22 mmol/L (ref 22–32)
Calcium: 7.1 mg/dL — ABNORMAL LOW (ref 8.9–10.3)
Chloride: 109 mmol/L (ref 98–111)
Creatinine, Ser: 0.69 mg/dL (ref 0.44–1.00)
GFR calc Af Amer: >60 mL/min (ref >60)
GFR calc non Af Amer: >60 mL/min (ref >60)
Glucose, Bld: 121 mg/dL — ABNORMAL HIGH (ref 70–99)
Potassium: 2.7 mmol/L — CL (ref 3.5–5.1)
Sodium: 143 mmol/L (ref 135–145)
Total Bilirubin: 1.4 mg/dL — ABNORMAL HIGH (ref 0.3–1.2)
Total Protein: 5.7 g/dL — ABNORMAL LOW (ref 6.5–8.1)

## 2019-10-15 LAB — PHOSPHORUS: Phosphorus: 4.6 mg/dL (ref 2.5–4.6)

## 2019-10-15 LAB — C-REACTIVE PROTEIN: CRP: 5.9 mg/dL — ABNORMAL HIGH (ref <1.0)

## 2019-10-15 LAB — D-DIMER, QUANTITATIVE: D-Dimer, Quant: 0.37 ug/mL-FEU (ref 0.00–0.50)

## 2019-10-15 LAB — FERRITIN: Ferritin: 882 ng/mL — ABNORMAL HIGH (ref 11–307)

## 2019-10-15 LAB — MAGNESIUM: Magnesium: 2 mg/dL (ref 1.7–2.4)

## 2019-10-15 MED ORDER — HYDRALAZINE HCL 10 MG PO TABS
10.0000 mg | ORAL_TABLET | Freq: Three times a day (TID) | ORAL | Status: DC
  Administered 2019-10-15: 10 mg via ORAL
  Filled 2019-10-15: qty 1

## 2019-10-15 MED ORDER — MORPHINE SULFATE (PF) 2 MG/ML IV SOLN
0.5000 mg | INTRAVENOUS | Status: DC | PRN
  Administered 2019-10-15 – 2019-10-16 (×2): 0.5 mg via INTRAVENOUS
  Filled 2019-10-15 (×3): qty 1

## 2019-10-15 MED ORDER — METHYLPREDNISOLONE SODIUM SUCC 40 MG IJ SOLR
40.0000 mg | Freq: Two times a day (BID) | INTRAMUSCULAR | Status: DC
  Administered 2019-10-15 – 2019-10-16 (×4): 40 mg via INTRAVENOUS
  Filled 2019-10-15 (×4): qty 1

## 2019-10-15 MED ORDER — LORAZEPAM 2 MG/ML IJ SOLN
0.5000 mg | Freq: Four times a day (QID) | INTRAMUSCULAR | Status: DC | PRN
  Administered 2019-10-15 – 2019-10-16 (×3): 0.5 mg via INTRAVENOUS
  Filled 2019-10-15 (×4): qty 1

## 2019-10-15 MED ORDER — DIVALPROEX SODIUM 125 MG PO CSDR: 125.0000 mg | DELAYED_RELEASE_CAPSULE | Freq: Two times a day (BID) | ORAL | Status: DC

## 2019-10-15 MED ORDER — TOCILIZUMAB 400 MG/20ML IV SOLN
400.0000 mg | Freq: Once | INTRAVENOUS | Status: AC
  Administered 2019-10-15: 400 mg via INTRAVENOUS
  Filled 2019-10-15: qty 20

## 2019-10-15 MED ORDER — HYDRALAZINE HCL 20 MG/ML IJ SOLN
10.0000 mg | Freq: Once | INTRAMUSCULAR | Status: AC
  Administered 2019-10-15: 10 mg via INTRAVENOUS
  Filled 2019-10-15: qty 1

## 2019-10-15 MED ORDER — POTASSIUM CHLORIDE 10 MEQ/100ML IV SOLN
10.0000 meq | INTRAVENOUS | Status: AC
  Administered 2019-10-15 (×5): 10 meq via INTRAVENOUS
  Filled 2019-10-15 (×5): qty 100

## 2019-10-15 NOTE — ED Notes (Signed)
ED TO INPATIENT HANDOFF REPORT  ED Nurse Name and Phone #: Tama High [phone]  S Name/Age/Gender Jessica Hood 86 y.o. female Room/Bed: 007C/007C  Code Status   Code Status: DNR  Home/SNF/Other Nursing Home Patient oriented to: self Is this baseline? Yes   Triage Complete: Triage complete  Chief Complaint COVID-19 [U07.1]  Triage Note Pt from [facility] for fever, sob and body aches, unknown COVID status but facility has COVID positive residents. Pt alert, disoriented per baseline, 92% on 4L [place] applied by EMS, pt does not wear oxygen at facility. Other VSS    Allergies Allergies  Allergen Reactions  . Grass Pollen(K-O-R-T-Swt Vern)     unknown  . Sulfa Antibiotics     unknown    Level of Care/Admitting Diagnosis ED Disposition    ED Disposition Condition [facility] Area: [place] [[id number]]  Level of Care: Telemetry Medical [104]  Covid Evaluation: Confirmed COVID Positive  Diagnosis: COVID-19 [[id number]]  Admitting Physician: Lequita Halt [[id number]]  Attending Physician: Lequita Halt [[id number]]  Estimated length of stay: 3 - 4 days  Certification:: I certify this patient will need inpatient services for at least 2 midnights       B Medical/Surgery History Past Medical History:  Diagnosis Date  . Anxiety and depression   . Dementia ([place]) 07/08/2019   Per EMS from date of 07/08/2019  . Fall 07/08/2019   History reviewed. No pertinent surgical history.   A IV Location/Drains/Wounds Patient Lines/Drains/Airways Status   Active Line/Drains/Airways    Name:   Placement date:   Placement time:   Site:   Days:   Peripheral IV 11/07/2019 Right Forearm   10/24/2019    1048    Forearm   1   External Urinary Catheter   10/19/2019    1035    --   1          Intake/Output Last 24 hours  Intake/Output Summary (Last 24 hours) at 10/15/2019 0323 Last data filed at 10/23/2019 1716 Gross per 24 hour  Intake 550 ml  Output --   Net 550 ml    Labs/Imaging Results for orders placed or performed during the hospital encounter of 11/06/2019 (from the past 48 hour(s))  Lactic acid, plasma     Status: Abnormal   Collection Time: 11/01/2019 10:45 AM  Result Value Ref Range   Lactic Acid, Venous 2.7 (HH) 0.5 - 1.9 mmol/L    Comment: CRITICAL RESULT CALLED TO, READ BACK BY AND VERIFIED WITH: D.BANKS RN @ [phone] 10/16/2019 BY C.EDENS Performed at [facility], [place] [address] [place],  [address]   CBC WITH DIFFERENTIAL     Status: Abnormal   Collection Time: 10/21/2019 10:45 AM  Result Value Ref Range   WBC 8.7 4.0 - 10.5 K/uL   RBC 3.79 (L) 3.87 - 5.11 MIL/uL   Hemoglobin 12.1 12.0 - 15.0 g/dL   HCT 37.8 36.0 - 46.0 %   MCV 99.7 80.0 - 100.0 fL   MCH 31.9 26.0 - 34.0 pg   MCHC 32.0 30.0 - 36.0 g/dL   RDW 13.2 11.5 - 15.5 %   Platelets 233 150 - 400 K/uL   nRBC 0.0 0.0 - 0.2 %   Neutrophils Relative % 83 %   Neutro Abs 7.3 1.7 - 7.7 K/uL   Lymphocytes Relative 10 %   Lymphs Abs 0.8 0.7 - 4.0 K/uL   Monocytes Relative 5 %  Monocytes Absolute 0.4 0.1 - 1.0 K/uL   Eosinophils Relative 0 %   Eosinophils Absolute 0.0 0.0 - 0.5 K/uL   Basophils Relative 0 %   Basophils Absolute 0.0 0.0 - 0.1 K/uL   Immature Granulocytes 2 %   Abs Immature Granulocytes 0.16 (H) 0.00 - 0.07 K/uL    Comment: Performed at [facility] [address] [place], [place] [address]  Comprehensive metabolic panel     Status: Abnormal   Collection Time: 11/07/2019 10:45 AM  Result Value Ref Range   Sodium 144 135 - 145 mmol/L   Potassium 3.7 3.5 - 5.1 mmol/L   Chloride 105 98 - 111 mmol/L   CO2 29 22 - 32 mmol/L   Glucose, Bld 116 (H) 70 - 99 mg/dL   BUN 39 (H) 8 - 23 mg/dL   Creatinine, Ser 1.19 (H) 0.44 - 1.00 mg/dL   Calcium 8.9 8.9 - 10.3 mg/dL   Total Protein 7.2 6.5 - 8.1 g/dL   Albumin 3.3 (L) 3.5 - 5.0 g/dL   AST 64 (H) 15 - 41 U/L   ALT 32 0 - 44 U/L   Alkaline Phosphatase 48 38 - 126 U/L   Total Bilirubin 1.2  0.3 - 1.2 mg/dL   GFR calc non Af Amer 40 (L) >60 mL/min   GFR calc Af Amer 47 (L) >60 mL/min   Anion gap 10 5 - 15    Comment: Performed at [facility] [address] [place], [place] [address]  D-dimer, quantitative     Status: Abnormal   Collection Time: 10/30/2019 10:45 AM  Result Value Ref Range   D-Dimer, Quant 1.45 (H) 0.00 - 0.50 ug/mL-FEU    Comment: (NOTE) At the manufacturer cut-off of 0.50 ug/mL FEU, this assay has been documented to exclude PE with a sensitivity and negative predictive value of 97 to 99%.  At this time, this assay has not been approved by the FDA to exclude DVT/VTE. Results should be correlated with clinical presentation. Performed at [facility], [place] [address] [place], [place] [address]   Procalcitonin     Status: None   Collection Time: 11/05/2019 10:45 AM  Result Value Ref Range   Procalcitonin 0.16 ng/mL    Comment:        Interpretation: PCT (Procalcitonin) <= 0.5 ng/mL: Systemic infection (sepsis) is not likely. Local bacterial infection is possible. (NOTE)       Sepsis PCT Algorithm           Lower Respiratory Tract                                      Infection PCT Algorithm    ----------------------------     ----------------------------         PCT < 0.25 ng/mL                PCT < 0.10 ng/mL         Strongly encourage             Strongly discourage   discontinuation of antibiotics    initiation of antibiotics    ----------------------------     -----------------------------       PCT 0.25 - 0.50 ng/mL            PCT 0.10 - 0.25 ng/mL  OR       >80% decrease in PCT            Discourage initiation of                                            antibiotics      Encourage discontinuation           of antibiotics    ----------------------------     -----------------------------         PCT >= 0.50 ng/mL              PCT 0.26 - 0.50 ng/mL               AND        <80% decrease in PCT             Encourage  initiation of                                             antibiotics       Encourage continuation           of antibiotics    ----------------------------     -----------------------------        PCT >= 0.50 ng/mL                  PCT > 0.50 ng/mL               AND         increase in PCT                  Strongly encourage                                      initiation of antibiotics    Strongly encourage escalation           of antibiotics                                     -----------------------------                                           PCT <= 0.25 ng/mL                                                 OR                                        > 80% decrease in PCT                                     Discontinue / Do not initiate  antibiotics Performed at [facility], [place] [address] [place], [place] [address]   Lactate dehydrogenase     Status: Abnormal   Collection Time: 10/21/2019 10:45 AM  Result Value Ref Range   LDH 466 (H) 98 - 192 U/L    Comment: Performed at [facility] [address], [place], [place] [address]  Ferritin     Status: Abnormal   Collection Time: 10/24/2019 10:45 AM  Result Value Ref Range   Ferritin 1,248 (H) 11 - 307 ng/mL    Comment: Performed at [facility], [place] [address] [place], [place] [address]  Triglycerides     Status: None   Collection Time: 10/28/2019 10:45 AM  Result Value Ref Range   Triglycerides 136 <150 mg/dL    Comment: Performed at [facility] [address], [place], [place] [address]  Fibrinogen     Status: Abnormal   Collection Time: 10/26/2019 10:45 AM  Result Value Ref Range   Fibrinogen 594 (H) 210 - 475 mg/dL    Comment: Performed at [facility] [address] [place], [place] [address]  C-reactive protein     Status: Abnormal   Collection Time: 10/30/2019 10:45 AM  Result Value Ref Range   CRP 12.3 (H) <1.0 mg/dL    Comment: Performed at  [facility] [address] [place], [place] [address]  POC SARS Coronavirus 2 Ag-ED - Nasal Swab (BD Veritor Kit)     Status: Abnormal   Collection Time: 11/09/2019 11:21 AM  Result Value Ref Range   SARS Coronavirus 2 Ag POSITIVE (A) NEGATIVE    Comment: (NOTE) SARS-CoV-2 antigen PRESENT. Positive results indicate the presence of viral antigens, but clinical correlation with patient history and other diagnostic information is necessary to determine patient infection status.  Positive results do not rule out bacterial infection or co-infection  with other viruses. False positive results are rare but can occur, and confirmatory RT-PCR testing may be appropriate in some circumstances. The expected result is Negative. Fact Sheet for Patients: PodPark.tn Fact Sheet for Providers: GiftContent.is  This test is not yet approved or cleared by the [place] FDA and  has been authorized for detection and/or diagnosis of SARS-CoV-2 by FDA under an Emergency Use Authorization (EUA).  This EUA will remain in effect (meaning this test can be used) for the duration of  the COVID-19 declaration under Section 564(b)(1) of the Act, 21 U.S.C. section 360bbb-3(b)(1), unless the a uthorization is terminated or revoked sooner.   Lactic acid, plasma     Status: None   Collection Time: 11/01/2019  2:35 PM  Result Value Ref Range   Lactic Acid, Venous 1.3 0.5 - 1.9 mmol/L    Comment: Performed at [facility], [place] [address] [place], [place] [address]  ABO/Rh     Status: None   Collection Time: 11/11/2019  2:35 PM  Result Value Ref Range   ABO/RH(D)      A POS Performed at [facility] [address], [place], [place] [address]    DG Chest Port 1 View  Result Date: 10/27/2019 CLINICAL DATA:  Shortness of breath. Additional history provided: Fever, shortness of breath and body aches, unknown COVID status. EXAM: PORTABLE CHEST  1 VIEW COMPARISON:  No pertinent prior studies available for comparison. FINDINGS: Heart size at the upper limits of normal. Patchy airspace opacity throughout the left lung and at the right lung base. No  evidence of pleural effusion or pneumothorax. No acute bony abnormality. Overlying cardiac monitoring leads. IMPRESSION: Patchy airspace opacity throughout the left lung and within the right lung base, likely reflecting multifocal pneumonia given provided history. Radiographic follow-up to resolution recommended. Electronically Signed   By: Kellie Simmering DO   On: 10/19/2019 10:50   ECHOCARDIOGRAM LIMITED  Result Date: 10/26/2019   ECHOCARDIOGRAM LIMITED REPORT   Patient Name:   KATIELYNN HORAN [facility] Date of Exam: 11/06/2019 Medical Rec #:  [id number]        Height:       61.0 in Accession #:    [id number]       Weight:       112.4 lb Date of Birth:  04/10/1928        BSA:          1.48 m Patient Age:    69 years         BP:           142/70 mmHg Patient Gender: F                HR:           52 bpm. Exam Location:  Inpatient  Procedure: Limited Echo, Limited Color Doppler and Cardiac Doppler Indications:    dyspnea 786.09  History:        Patient has no prior history of Echocardiogram examinations.  Sonographer:    Johny Chess Referring Phys: [id number] Krebs  1. Left ventricular ejection fraction, by visual estimation, is 60 to 65%. The left ventricle has normal function. There is no increased left ventricular wall thickness.  2. The left ventricle has no regional wall motion abnormalities.  3. Global right ventricle has normal systolc function.The right ventricular size is normal. no increase in right ventricular wall thickness.  4. Presence of pericardial fat pad.  5. The mitral valve is grossly normal. Trivial mitral valve regurgitation.  6. The tricuspid valve was grossly normal. Tricuspid valve regurgitation is mild.  7. Tricuspid valve regurgitation is mild.  8. No evidence of aortic valve  sclerosis or stenosis.  9. TR signal is inadequate for assessing pulmonary artery systolic pressure. 10. The inferior vena cava is normal in size with greater than 50% respiratory variability, suggesting right atrial pressure of 3 mmHg. 11. No prior Echocardiogram. FINDINGS  Left Ventricle: Left ventricular ejection fraction, by visual estimation, is 60 to 65%. The left ventricle has normal function. The left ventricle has no regional wall motion abnormalities. The left ventricular internal cavity size was the left ventricle is normal in size. There is no increased left ventricular wall thickness. Left ventricular diastolic parameters were normal. Normal left atrial pressure. Right Ventricle: The right ventricular size is normal. No increase in right ventricular wall thickness. Global RV systolic function is has normal systolic function. Left Atrium: Left atrial size was normal in size. Right Atrium: Right atrial size was normal in size. Right atrial pressure is estimated at 3 mmHg. Pericardium: There is no evidence of pericardial effusion is seen. There is no evidence of pericardial effusion. Presence of pericardial fat pad. Mitral Valve: The mitral valve is grossly normal. MV Area by PHT, 3.20 cm. MV PHT, 68.73 msec. Trivial mitral valve regurgitation. Tricuspid Valve: The tricuspid valve is grossly normal. Tricuspid valve regurgitation is mild. Aortic Valve: The aortic valve is tricuspid. Aortic valve regurgitation is not visualized. The aortic valve is structurally normal, with no evidence of sclerosis or stenosis. Pulmonic Valve:  The pulmonic valve was grossly normal. Pulmonic valve regurgitation is not visualized by color flow Doppler. Pulmonic regurgitation is not visualized by color flow Doppler. Aorta: The aortic root and ascending aorta are structurally normal, with no evidence of dilitation. Venous: The inferior vena cava is normal in size with greater than 50% respiratory variability, suggesting right  atrial pressure of 3 mmHg. Shunts: No atrial level shunt detected by color flow Doppler.  LEFT VENTRICLE          Normals PLAX 2D LVIDd:         4.80 cm  3.6 cm   Diastology                 Normals LVIDs:         2.80 cm  1.7 cm   LV e' lateral:   9.68 cm/s 6.42 cm/s LV PW:         0.80 cm  1.4 cm   LV E/e' lateral: 8.3       15.4 LV IVS:        0.90 cm  1.3 cm   LV e' medial:    7.07 cm/s 6.96 cm/s LVOT diam:     1.90 cm  2.0 cm   LV E/e' medial:  11.3      6.96 LV SV:         78 ml    79 ml LV SV Index:   52.54    45 ml/m2 LVOT Area:     2.84 cm 3.14 cm2  LEFT ATRIUM         Index LA diam:    3.60 cm 2.43 cm/m  AORTIC VALVE             Normals LVOT Vmax:   94.00 cm/s LVOT Vmean:  53.300 cm/s 75 cm/s LVOT VTI:    0.238 m     25.3 cm  AORTA                 Normals Ao Root diam: 2.80 cm 31 mm Ao Asc diam:  3.30 cm 31 mm MITRAL VALVE              Normals MV Area (PHT): 3.20 cm             SHUNTS MV PHT:        68.73 msec 55 ms     Systemic VTI:  0.24 m MV Decel Time: 237 msec   187 ms    Systemic Diam: 1.90 cm MV E velocity: 80.10 cm/s 103 cm/s MV A velocity: 78.40 cm/s 70.3 cm/s MV E/A ratio:  1.02       1.5  Eleonore Chiquito MD Electronically signed by Eleonore Chiquito MD Signature Date/Time: 10/18/2019/6:47:40 PMThe mitral valve is grossly normal.    Final     Pending Labs Unresulted Labs (From admission, onward)    Start     Ordered   10/15/19 0500  CBC with Differential/Platelet  Daily,   R     10/25/2019 1237   10/15/19 0500  Comprehensive metabolic panel  Daily,   R     10/16/2019 1237   10/15/19 0500  C-reactive protein  Daily,   R     10/16/2019 1237   10/15/19 0500  D-dimer, quantitative (not at [facility])  Daily,   R     11/08/2019 1237   10/15/19 0500  Ferritin  Daily,   R     10/16/2019 1237   10/15/19 0500  Magnesium  Daily,   R     10/22/2019 1237   10/15/19 0500  Phosphorus  Daily,   R     10/20/2019 1237   10/22/2019 1013  Blood Culture (routine x 2)  BLOOD CULTURE X 2,   STAT     10/20/2019 1012           Vitals/Pain Today's Vitals   10/15/19 0100 10/15/19 0200 10/15/19 0230 10/15/19 0300  BP: (!) 155/72 (!) 154/71 (!) 156/60 (!) 160/72  Pulse: (!) 47 (!) 44 (!) 45 (!) 46  Resp: 20 14 (!) 24 (!) 22  Temp:      TempSrc:      SpO2: 98% 96% (!) 86% 92%    Isolation Precautions Airborne and Contact precautions  Medications Medications  albuterol (VENTOLIN HFA) 108 (90 Base) MCG/ACT inhaler 2 puff (has no administration in time range)  fluticasone (FLONASE) 50 MCG/ACT nasal spray 1 spray (has no administration in time range)  heparin injection 5,000 Units (5,000 Units Subcutaneous Given 10/22/2019 2304)  remdesivir 200 mg in sodium chloride 0.9% 250 mL IVPB (0 mg Intravenous Stopped 10/13/2019 1716)    Followed by  remdesivir 100 mg in sodium chloride 0.9 % 100 mL IVPB (has no administration in time range)  albumin human 25 % solution 12.5 g (12.5 g Intravenous New Bag/Given 10/15/19 0155)  acetaminophen (TYLENOL) suppository 650 mg (has no administration in time range)  methylPREDNISolone sodium succinate (SOLU-MEDROL) 40 mg/mL injection 40 mg (has no administration in time range)  dexamethasone (DECADRON) injection 10 mg (10 mg Intravenous Given 11/06/2019 1143)  sodium chloride 0.9 % bolus 250 mL (0 mLs Intravenous Stopped 10/29/2019 1254)    Mobility non-ambulatory High fall risk   Focused Assessments Pulmonary Assessment Handoff:  Lung sounds: Bilateral Breath Sounds: Diminished L Breath Sounds: Clear, Diminished R Breath Sounds: Clear, Diminished O2 Device: Nasal Cannula O2 Flow Rate (L/min): 3 L/min      R Recommendations: See Admitting Provider Note  Report given to:   Additional Notes: Pt at baseline

## 2019-10-15 NOTE — Progress Notes (Signed)
Pt satting low on 6L [place], like 84 -85%. I put her on 15L HFNC she came up to about 88%. I put her on 15L HFNC and NRB mask she is satting 95-96% now. I will call Respiratory to see if they can take a look at her. Will also continue to monitor O2 levels.

## 2019-10-15 NOTE — Progress Notes (Signed)
Jessica Hood [id number] Admission Data: 10/15/2019 4:25 AM Attending Provider: Emeline General, MD  YFV:CBSW, Verlon Au, NP Consults/ Treatment Team:   Jessica Hood is a 86 y.o. female patient admitted from ED awake, alert  & orientated  X self,  DNR, VSS - Blood pressure (!) 160/72, pulse (!) 46, temperature 98.8 F (37.1 C), temperature source Rectal, resp. rate (!) 22, SpO2 92 %., O2    4 L nasal cannular, no c/o shortness of breath, no c/o chest pain, no distress noted. Tele # [id number] placed and pt is currently running:sinus bradycardia.   IV site WDL:  antecubital right, condition patent and no redness with a transparent dsg that's clean dry and intact.  Allergies:   Allergies  Allergen Reactions  . Grass Pollen(K-O-R-T-Swt Vern)     unknown  . Sulfa Antibiotics     unknown     Past Medical History:  Diagnosis Date  . Anxiety and depression   . Dementia ([facility]) 07/08/2019   Per EMS from date of 07/08/2019  . Fall 07/08/2019    Pt orientation to unit, room and routine. Information packet given to patient/family and safety video watched.  Admission INP armband ID verified with patient/family, and in place. SR up x 2, fall risk assessment complete with Patient and family verbalizing understanding of risks associated with falls. Pt verbalizes an understanding of how to use the call bell and to call for help before getting out of bed.  Skin, clean-dry- intact without evidence of bruising, or skin tears.   No evidence of skin break down noted on exam. no rashes, no ecchymoses, no wounds  Will cont to monitor and assist as needed.  Corky Downs, RN 10/15/2019 4:25 AM

## 2019-10-15 NOTE — Progress Notes (Addendum)
Discussed visitation policy with pt's daughter, Edmon Crape. Edmon Crape extremely understanding and verbalized she was appreciative that if patient nears end of life, that she will be able to visit for 15 minutes. Discussed the option of facetime via ipad, however Jenna declined at this time.

## 2019-10-15 NOTE — Progress Notes (Signed)
   10/15/19 0700  SLP Visit Information  SLP Received On 10/15/19  General Information  HPI  Jessica Hood is a 86 y.o. female NH resident with medical history significant of advanced dementia, presented with Covid infection increasing short of breath and fever at nursing home.  Patient found to be easily desat, X-ray showed multifocal pneumonia. No prior SLP notes.   Type of Study Bedside Swallow Evaluation  Diet Prior to this Study Dysphagia 1 (puree);Thin liquids  Temperature Spikes Noted No  Respiratory Status Non-rebreather  History of Recent Intubation No  Behavior/Cognition Alert;Requires cueing;Distractible;Confused  Oral Cavity Assessment Dry  Oral Care Completed by SLP No  Oral Cavity - Dentition Adequate natural dentition  Self-Feeding Abilities Total assist  Patient Positioning Upright in bed  Baseline Vocal Quality Low vocal intensity  Volitional Cough Cognitively unable to elicit  Volitional Swallow Unable to elicit  Oral Motor/Sensory Function  Overall Oral Motor/Sensory Function Other (comment) (cannot follow commands)  Ice Chips  Ice chips Impaired  Presentation Spoon  Oral Phase Impairments Poor awareness of bolus;Impaired mastication  Oral Phase Functional Implications Prolonged oral transit  Pharyngeal Phase Impairments Throat Clearing - Immediate  Thin Liquid  Thin Liquid NT  Nectar Thick Liquid  Nectar Thick Liquid Impaired  Presentation Spoon  Oral phase functional implications Prolonged oral transit;Oral holding  Pharyngeal Phase Impairments Suspected delayed Swallow;Throat Clearing - Delayed  Honey Thick Liquid  Honey Thick Liquid Impaired  Presentation Spoon  Oral Phase Functional Implications Oral holding;Prolonged oral transit  Pharyngeal Phase Impairments Suspected delayed Swallow;Throat Clearing - Delayed  Puree  Puree Impaired  Presentation Spoon  Oral Phase Functional Implications Prolonged oral transit;Oral holding  Pharyngeal Phase  Impairments Throat Clearing - Immediate  Solid  Solid NT  SLP Assessment  Clinical Impression Statement (ACUTE ONLY) Pt presents with desaturation with mask removal and with HFNC, easily short of breath. She is also easily distractible, sensitive to touch, poorly attentive to Po at time with prolonged oral holding/delayed swallow initiation. She has immediate throat clearing with all textures trialed. Would recommend NPO except for meds in puree for now, SLP will f/u for trials if breathing more stable.   SLP Visit Diagnosis Dysphagia, oropharyngeal phase (R13.12)  Impact on safety and function Severe aspiration risk  Other Related Risk Factors Decreased respiratory status  Swallow Evaluation Recommendations  SLP Diet Recommendations NPO except meds  Treatment Plan  Other Recommendations Have oral suction available  Treatment Recommendations Therapy as outlined in treatment plan below  Follow up Recommendations Skilled Nursing facility  Prognosis  Prognosis for Safe Diet Advancement Fair  Barriers to Reach Goals Severity of deficits  Individuals Consulted  Consulted and Agree with Results and Recommendations Patient;RN;MD  Progression Toward Goals  Progression toward goals Progressing toward goals  SLP Time Calculation  SLP Start Time (ACUTE ONLY) 1100  SLP Stop Time (ACUTE ONLY) 1115  SLP Time Calculation (min) (ACUTE ONLY) 15 min  SLP Evaluations  $ SLP Speech Visit 1 Visit  SLP Evaluations  $BSS Swallow 1 Procedure  $Swallowing Treatment 1 Procedure

## 2019-10-15 NOTE — Progress Notes (Signed)
PROGRESS NOTE    Jessica Hood  DVV:[id number] DOB: Nov 09, 1927 DOA: 10/27/2019 PCP: Maxwell Caul, NP   Brief Narrative: 86 year old nursing home resident past medical history significant for advanced dementia presented with worsening shortness of breath fever, recently diagnosed with Covid.  Skilled nursing facility recently have been an outbreak.  Patient transferred to the emergency department due to development of new hypoxemia, increasing shortness of breath.  Evaluation in the ED chest x-ray showed multifocal pneumonia.   Assessment & Plan:   Active Problems:   COVID-19 virus infection   COVID-19  1-Acute Hypoxic Respiratory Failure secondary to COVID-19 pneumonia: Patient presented with hypoxemia, chest x-ray with airspace disease on the left lawn and on the basis of the right consistent with multifocal pneumonia. COVID-19 Labs  Recent Labs    11/09/2019 1045  DDIMER 1.45*  FERRITIN 1,248*  LDH 466*  CRP 12.3*   Patient develops worsening hypoxemia, requiring NBM. Discussed with daughter risk and benefit of Actemra, anecdotal use.  She is ok with patient getting morphine for resp distress, she does not wants her mother to sufer. She was ok to anxiolytic if needed for agitation. Palliative care consulted for goals of care.  No results found for: SARSCOV2NAA   AKI: Present with a BUN of 39, creatinine 1.1.  Patient prior creatinine per records 0.9. Replete k.  Renal function improved.   Transaminases: Likely related to viral illness.  Monitor.  Hypertension: hydralazine ordered.   Sinus bradycardia: asymptomatic.  Replete k.   Advanced dementia: On Depakote. Resume   Lactic acidosis: likely  related to hypoxemia and viral illness Improved. Pro-calcitonin less than 0.16.    Estimated body mass index is 21.24 kg/m as calculated from the following:   Height as of 08/21/19: 5\' 1"  (1.549 m).   Weight as of 08/21/19: 51 kg.   DVT prophylaxis: Heparin  Code  Status: DNR Family Communication: Daughter over phone informed of critical condition of her mother.  Disposition Plan:  Consultants:   None  Procedures:     Antimicrobials:    Subjective: Alert, smile. On NBK.  Subsequently got agitated, removing mask   Objective: Vitals:   10/15/19 0230 10/15/19 0300 10/15/19 0508 10/15/19 0555  BP: (!) 156/60 (!) 160/72 (!) 163/65   Pulse: (!) 45 (!) 46  (!) 49  Resp: (!) 24 (!) 22  (!) 25  Temp:   97.6 F (36.4 C)   TempSrc:      SpO2: (!) 86% 92%  (!) 84%    Intake/Output Summary (Last 24 hours) at 10/15/2019 0735 Last data filed at 10/24/2019 1716 Gross per 24 hour  Intake 550 ml  Output --  Net 550 ml   There were no vitals filed for this visit.  Examination:  General exam: alert, thin Respiratory system: Clear to auscultation. Respiratory effort normal. Cardiovascular system: S1 & S2 heard, RRR. No JVD, murmurs, rubs, gallops or clicks. No pedal edema. Gastrointestinal system: Abdomen is nondistended, soft and nontender. No organomegaly or masses felt. Normal bowel sounds heard. Central nervous system: Alert  Extremities: Symmetric 5 x 5 power. Skin: No rashes, lesions or ulcers    Data Reviewed: I have personally reviewed following labs and imaging studies  CBC: Recent Labs  Lab 10/24/2019 1045  WBC 8.7  NEUTROABS 7.3  HGB 12.1  HCT 37.8  MCV 99.7  PLT 626   Basic Metabolic Panel: Recent Labs  Lab 10/15/2019 1045  NA 144  K 3.7  CL 105  CO2 29  GLUCOSE 116*  BUN 39*  CREATININE 1.19*  CALCIUM 8.9   GFR: CrCl cannot be calculated (Unknown ideal weight.). Liver Function Tests: Recent Labs  Lab 10/31/2019 1045  AST 64*  ALT 32  ALKPHOS 48  BILITOT 1.2  PROT 7.2  ALBUMIN 3.3*   No results for input(s): LIPASE, AMYLASE in the last 168 hours. No results for input(s): AMMONIA in the last 168 hours. Coagulation Profile: No results for input(s): INR, PROTIME in the last 168 hours. Cardiac Enzymes: No  results for input(s): CKTOTAL, CKMB, CKMBINDEX, TROPONINI in the last 168 hours. BNP (last 3 results) No results for input(s): PROBNP in the last 8760 hours. HbA1C: No results for input(s): HGBA1C in the last 72 hours. CBG: No results for input(s): GLUCAP in the last 168 hours. Lipid Profile: Recent Labs    11/10/2019 1045  TRIG 136   Thyroid Function Tests: No results for input(s): TSH, T4TOTAL, FREET4, T3FREE, THYROIDAB in the last 72 hours. Anemia Panel: Recent Labs    Oct 17, 2019 1045  FERRITIN 1,248*   Sepsis Labs: Recent Labs  Lab October 17, 2019 1045 17-Oct-2019 1435  PROCALCITON 0.16  --   LATICACIDVEN 2.7* 1.3    No results found for this or any previous visit (from the past 240 hour(s)).       Radiology Studies: DG Chest Port 1 View  Result Date: October 17, 2019 CLINICAL DATA:  Shortness of breath. Additional history provided: Fever, shortness of breath and body aches, unknown COVID status. EXAM: PORTABLE CHEST 1 VIEW COMPARISON:  No pertinent prior studies available for comparison. FINDINGS: Heart size at the upper limits of normal. Patchy airspace opacity throughout the left lung and at the right lung base. No evidence of pleural effusion or pneumothorax. No acute bony abnormality. Overlying cardiac monitoring leads. IMPRESSION: Patchy airspace opacity throughout the left lung and within the right lung base, likely reflecting multifocal pneumonia given provided history. Radiographic follow-up to resolution recommended. Electronically Signed   By: Jackey Loge DO   On: 10/28/2019 10:50   ECHOCARDIOGRAM LIMITED  Result Date: October 17, 2019   ECHOCARDIOGRAM LIMITED REPORT   Patient Name:   Jessica Hood [facility] Date of Exam: October 17, 2019 Medical Rec #:  [id number]        Height:       61.0 in Accession #:    [id number]       Weight:       112.4 lb Date of Birth:  26-Nov-1927        BSA:          1.48 m Patient Age:    89 years         BP:           142/70 mmHg Patient Gender: F                HR:            52 bpm. Exam Location:  Inpatient  Procedure: Limited Echo, Limited Color Doppler and Cardiac Doppler Indications:    dyspnea 786.09  History:        Patient has no prior history of Echocardiogram examinations.  Sonographer:    Delcie Roch Referring Phys: [id number] PING T ZHANG IMPRESSIONS  1. Left ventricular ejection fraction, by visual estimation, is 60 to 65%. The left ventricle has normal function. There is no increased left ventricular wall thickness.  2. The left ventricle has no regional wall motion abnormalities.  3. Global right ventricle has normal systolc function.The right ventricular size is normal.  no increase in right ventricular wall thickness.  4. Presence of pericardial fat pad.  5. The mitral valve is grossly normal. Trivial mitral valve regurgitation.  6. The tricuspid valve was grossly normal. Tricuspid valve regurgitation is mild.  7. Tricuspid valve regurgitation is mild.  8. No evidence of aortic valve sclerosis or stenosis.  9. TR signal is inadequate for assessing pulmonary artery systolic pressure. 10. The inferior vena cava is normal in size with greater than 50% respiratory variability, suggesting right atrial pressure of 3 mmHg. 11. No prior Echocardiogram. FINDINGS  Left Ventricle: Left ventricular ejection fraction, by visual estimation, is 60 to 65%. The left ventricle has normal function. The left ventricle has no regional wall motion abnormalities. The left ventricular internal cavity size was the left ventricle is normal in size. There is no increased left ventricular wall thickness. Left ventricular diastolic parameters were normal. Normal left atrial pressure. Right Ventricle: The right ventricular size is normal. No increase in right ventricular wall thickness. Global RV systolic function is has normal systolic function. Left Atrium: Left atrial size was normal in size. Right Atrium: Right atrial size was normal in size. Right atrial pressure is estimated at 3 mmHg.  Pericardium: There is no evidence of pericardial effusion is seen. There is no evidence of pericardial effusion. Presence of pericardial fat pad. Mitral Valve: The mitral valve is grossly normal. MV Area by PHT, 3.20 cm. MV PHT, 68.73 msec. Trivial mitral valve regurgitation. Tricuspid Valve: The tricuspid valve is grossly normal. Tricuspid valve regurgitation is mild. Aortic Valve: The aortic valve is tricuspid. Aortic valve regurgitation is not visualized. The aortic valve is structurally normal, with no evidence of sclerosis or stenosis. Pulmonic Valve: The pulmonic valve was grossly normal. Pulmonic valve regurgitation is not visualized by color flow Doppler. Pulmonic regurgitation is not visualized by color flow Doppler. Aorta: The aortic root and ascending aorta are structurally normal, with no evidence of dilitation. Venous: The inferior vena cava is normal in size with greater than 50% respiratory variability, suggesting right atrial pressure of 3 mmHg. Shunts: No atrial level shunt detected by color flow Doppler.  LEFT VENTRICLE          Normals PLAX 2D LVIDd:         4.80 cm  3.6 cm   Diastology                 Normals LVIDs:         2.80 cm  1.7 cm   LV e' lateral:   9.68 cm/s 6.42 cm/s LV PW:         0.80 cm  1.4 cm   LV E/e' lateral: 8.3       15.4 LV IVS:        0.90 cm  1.3 cm   LV e' medial:    7.07 cm/s 6.96 cm/s LVOT diam:     1.90 cm  2.0 cm   LV E/e' medial:  11.3      6.96 LV SV:         78 ml    79 ml LV SV Index:   52.54    45 ml/m2 LVOT Area:     2.84 cm 3.14 cm2  LEFT ATRIUM         Index LA diam:    3.60 cm 2.43 cm/m  AORTIC VALVE             Normals LVOT Vmax:   94.00 cm/s LVOT Vmean:  53.300 cm/s 75 cm/s LVOT VTI:    0.238 m     25.3 cm  AORTA                 Normals Ao Root diam: 2.80 cm 31 mm Ao Asc diam:  3.30 cm 31 mm MITRAL VALVE              Normals MV Area (PHT): 3.20 cm             SHUNTS MV PHT:        68.73 msec 55 ms     Systemic VTI:  0.24 m MV Decel Time: 237 msec   187  ms    Systemic Diam: 1.90 cm MV E velocity: 80.10 cm/s 103 cm/s MV A velocity: 78.40 cm/s 70.3 cm/s MV E/A ratio:  1.02       1.5  Lennie Odor MD Electronically signed by Lennie Odor MD Signature Date/Time: 10/15/2019/6:47:40 PMThe mitral valve is grossly normal.    Final         Scheduled Meds: . fluticasone  1 spray Each Nare Daily  . heparin  5,000 Units Subcutaneous Q8H  . methylPREDNISolone (SOLU-MEDROL) injection  40 mg Intravenous Daily   Continuous Infusions: . albumin human 12.5 g (10/15/19 0650)  . remdesivir 100 mg in NS 100 mL       LOS: 1 day    Time spent: 35 minutes.     Alba Cory, MD [facility]   If 7PM-7AM, please contact night-coverage [email or web address] Password [facility] 10/15/2019, 7:35 AM

## 2019-10-15 NOTE — Progress Notes (Signed)
PT Cancellation Note  Patient Details Name: Jessica Hood MRN: [id number] DOB: 30-Jan-1928   Cancelled Treatment:    Reason Eval/Treat Not Completed: Medical issues which prohibited therapy. Hold PT eval today, per MD, due to increased O2 demands. PT to follow up tomorrow.   Ilda Foil 10/15/2019, 9:22 AM   Aida Raider, PT  Office # [phone] Pager [phone]

## 2019-10-16 DIAGNOSIS — U071 COVID-19: Principal | ICD-10-CM

## 2019-10-16 DIAGNOSIS — Z515 Encounter for palliative care: Secondary | ICD-10-CM

## 2019-10-16 DIAGNOSIS — A419 Sepsis, unspecified organism: Secondary | ICD-10-CM

## 2019-10-16 LAB — FERRITIN: Ferritin: 1395 ng/mL — ABNORMAL HIGH (ref 11–307)

## 2019-10-16 LAB — COMPREHENSIVE METABOLIC PANEL
ALT: 25 U/L (ref 0–44)
AST: 37 U/L (ref 15–41)
Albumin: 3.3 g/dL — ABNORMAL LOW (ref 3.5–5.0)
Alkaline Phosphatase: 43 U/L (ref 38–126)
Anion gap: 13 (ref 5–15)
BUN: 53 mg/dL — ABNORMAL HIGH (ref 8–23)
CO2: 25 mmol/L (ref 22–32)
Calcium: 8.9 mg/dL (ref 8.9–10.3)
Chloride: 108 mmol/L (ref 98–111)
Creatinine, Ser: 0.73 mg/dL (ref 0.44–1.00)
GFR calc Af Amer: >60 mL/min (ref >60)
GFR calc non Af Amer: >60 mL/min (ref >60)
Glucose, Bld: 148 mg/dL — ABNORMAL HIGH (ref 70–99)
Potassium: 4.5 mmol/L (ref 3.5–5.1)
Sodium: 146 mmol/L — ABNORMAL HIGH (ref 135–145)
Total Bilirubin: 0.8 mg/dL (ref 0.3–1.2)
Total Protein: 6.9 g/dL (ref 6.5–8.1)

## 2019-10-16 LAB — CBC WITH DIFFERENTIAL/PLATELET
Abs Immature Granulocytes: 0.3 10*3/uL — ABNORMAL HIGH (ref 0.00–0.07)
Basophils Absolute: 0 10*3/uL (ref 0.0–0.1)
Basophils Relative: 0 %
Eosinophils Absolute: 0 10*3/uL (ref 0.0–0.5)
Eosinophils Relative: 0 %
HCT: 39 % (ref 36.0–46.0)
Hemoglobin: 12.6 g/dL (ref 12.0–15.0)
Immature Granulocytes: 2 %
Lymphocytes Relative: 8 %
Lymphs Abs: 1 10*3/uL (ref 0.7–4.0)
MCH: 31.5 pg (ref 26.0–34.0)
MCHC: 32.3 g/dL (ref 30.0–36.0)
MCV: 97.5 fL (ref 80.0–100.0)
Monocytes Absolute: 0.4 10*3/uL (ref 0.1–1.0)
Monocytes Relative: 3 %
Neutro Abs: 11 10*3/uL — ABNORMAL HIGH (ref 1.7–7.7)
Neutrophils Relative %: 87 %
Platelets: 280 10*3/uL (ref 150–400)
RBC: 4 MIL/uL (ref 3.87–5.11)
RDW: 13.2 % (ref 11.5–15.5)
WBC: 12.7 10*3/uL — ABNORMAL HIGH (ref 4.0–10.5)
nRBC: 0 % (ref 0.0–0.2)

## 2019-10-16 LAB — PHOSPHORUS: Phosphorus: 3 mg/dL (ref 2.5–4.6)

## 2019-10-16 LAB — C-REACTIVE PROTEIN: CRP: 6.3 mg/dL — ABNORMAL HIGH (ref <1.0)

## 2019-10-16 LAB — MAGNESIUM: Magnesium: 2.8 mg/dL — ABNORMAL HIGH (ref 1.7–2.4)

## 2019-10-16 LAB — D-DIMER, QUANTITATIVE: D-Dimer, Quant: 0.84 ug/mL-FEU — ABNORMAL HIGH (ref 0.00–0.50)

## 2019-10-16 MED ORDER — HALOPERIDOL LACTATE 5 MG/ML IJ SOLN: 0.5000 mg | Freq: Four times a day (QID) | INTRAMUSCULAR | Status: DC | PRN

## 2019-10-16 MED ORDER — MORPHINE BOLUS VIA INFUSION
2.0000 mg | INTRAVENOUS | Status: DC | PRN
  Administered 2019-10-16: 4 mg via INTRAVENOUS
  Filled 2019-10-16: qty 4

## 2019-10-16 MED ORDER — LORAZEPAM 2 MG/ML IJ SOLN
1.0000 mg | Freq: Four times a day (QID) | INTRAMUSCULAR | Status: DC | PRN
  Administered 2019-10-16: 1 mg via INTRAVENOUS

## 2019-10-16 MED ORDER — MORPHINE BOLUS VIA INFUSION
1.0000 mg | INTRAVENOUS | Status: DC | PRN
  Administered 2019-10-16: 14:00:00 2 mg via INTRAVENOUS
  Filled 2019-10-16: qty 2

## 2019-10-16 MED ORDER — LORAZEPAM 2 MG/ML IJ SOLN
0.5000 mg | Freq: Three times a day (TID) | INTRAMUSCULAR | Status: DC
  Administered 2019-10-16 – 2019-10-17 (×3): 0.5 mg via INTRAVENOUS
  Filled 2019-10-16 (×3): qty 1

## 2019-10-16 MED ORDER — DEXTROSE 5 % IV SOLN: INTRAVENOUS | Status: DC

## 2019-10-16 MED ORDER — MORPHINE SULFATE (PF) 2 MG/ML IV SOLN: 1.0000 mg | INTRAVENOUS | Status: DC | PRN

## 2019-10-16 MED ORDER — MORPHINE 100MG IN NS 100ML (1MG/ML) PREMIX INFUSION
1.0000 mg/h | INTRAVENOUS | Status: DC
  Administered 2019-10-16: 1 mg/h via INTRAVENOUS
  Administered 2019-10-17: 2 mg/h via INTRAVENOUS
  Filled 2019-10-16 (×2): qty 100

## 2019-10-16 MED ORDER — MORPHINE SULFATE (PF) 2 MG/ML IV SOLN
1.0000 mg | INTRAVENOUS | Status: DC | PRN
  Administered 2019-10-16: 1 mg via INTRAVENOUS
  Filled 2019-10-16: qty 1

## 2019-10-16 NOTE — Consult Note (Signed)
Consultation Note Date: 10/16/2019   Patient Name: Jessica Hood  DOB: January 12, 1928  MRN: [id number]  Age / Sex: 86 y.o., female  PCP: Cristina Gong, NP Referring Physician: Alba Cory, MD  Reason for Consultation: Establishing goals of care, Non pain symptom management, Pain control and Psychosocial/spiritual support  HPI/Patient Profile: 86 y.o. female  with past medical history of advanced dementia who was admitted on 10/21/2019 with covid 19 multifocal pneumonia.  She has written documentation that indicates her wishes not to be intubated.  Over night she was placed on 45L of oxygen with a non-rebreather mask to maintain oxygen saturation above 90 and she is hypothermic.   Clinical Assessment and Goals of Care:  I have reviewed medical records including EPIC notes, labs and imaging, received report from Dr. Sunnie Nielsen, and spoken with her daughter Edmon Crape to discuss diagnosis prognosis, GOC, EOL wishes, disposition and options.  I introduced Palliative Medicine as specialized medical care for people living with serious illness. It focuses on providing relief from the symptoms and stress of a serious illness.   We discussed a brief life review of the patient.  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  Her husband died in the 15s, and she took care of her mother until she died at age 59.  [name] has 11 great grand children.  She was very active in the [address] and the [employer].  She sung in the Choir and loves music.   She currently lives in a SNF with advanced dementia.  She walks and eats at SNF, but lives in a time when she was a little girl.  In her mind her mother and father are alive.  Per Edmon Crape her daughter it is difficult to communicate with her at times because she does not understand her speech.  We discussed her current illness and what it means in  the larger context of her on-going co-morbidities.  Natural disease trajectory and expectations at EOL were discussed.  Edmon Crape expresses that the Doctor has been great about calling her and explaining her mother's condition.  Edmon Crape is aware that her mother may be nearing end of life and her priority is her comfort.  However, Edmon Crape states she wants the treatment for COVID continued "Just in case".  I attempted to elicit values and goals of care important to the patient.  Per Edmon Crape her mother would not have wanted to live in a SNF with advanced dementia.  She feels her quality of life was poor.  "My mother would have said Let me go". The difference between aggressive medical intervention and comfort care was considered in light of the patient's goals of care.   Advanced directives, concepts specific to code status, artifical feeding and hydration, and rehospitalization were considered and discussed.  Edmon Crape and her brother Molly Maduro "Chip" Roszak are the patient's HCPOA.  Her brother Chip and other brother Jillyn Hidden are Durable POA.  Her mother spelled out her wishes clearly in writing.  Edmon Crape wishes  to speak with her brothers once again and make decisions regarding continued treatment vs comfort care.  However given her level of dyspnea and oxygen requirement Letitia Libra supported the idea of starting a low dose drip to ease her mother's symptoms.  Questions and concerns were addressed.   The family was encouraged to call with questions or concerns.    Primary Decision Maker:  NEXT OF KIN  Janna and Chip Thom are joint HCPOA.    SUMMARY OF RECOMMENDATIONS     Continue current care for now.  Janna and I made a plan to talk again later today about a more firm goal - attempt to stabilize vs comfort measures only.   Code Status/Advance Care Planning:  DNR  Symptom Management:   Low dose morphine gtt (1 mg per hour) along with low dose bolus PRN for dyspnea/air hunger relief  Low dose haldol (0.5 mg q 6  hours PRN) for agitation  Additional Recommendations (Limitations, Scope, Preferences):  Full Scope Treatment for now will talk again later today.  Palliative Prophylaxis:   Delirium Protocol  Psycho-social/Spiritual:   Desire for further Chaplaincy support:  Welcomed   Prognosis:  Likely hours to days.  Discharge Planning: To Be Determined.  Concerned for hospital death.      Primary Diagnoses: Present on Admission: . COVID-19   I have reviewed the medical record, interviewed the patient and family, and examined the patient. The following aspects are pertinent.  Past Medical History:  Diagnosis Date  . Anxiety and depression   . Dementia ([place]) 07/08/2019   Per EMS from date of 07/08/2019  . Fall 07/08/2019   Social History   Socioeconomic History  . Marital status: Widowed    Spouse name: Not on file  . Number of children: Not on file  . Years of education: Not on file  . Highest education level: Not on file  Occupational History  . Not on file  Tobacco Use  . Smoking status: Never Smoker  . Smokeless tobacco: Never Used  Substance and Sexual Activity  . Alcohol use: Not Currently  . Drug use: Not Currently  . Sexual activity: Not Currently  Other Topics Concern  . Not on file  Social History Narrative  . Not on file   Social Determinants of Health   Financial Resource Strain:   . Difficulty of Paying Living Expenses: Not on file  Food Insecurity:   . Worried About Charity fundraiser in the Last Year: Not on file  . Ran Out of Food in the Last Year: Not on file  Transportation Needs:   . Lack of Transportation (Medical): Not on file  . Lack of Transportation (Non-Medical): Not on file  Physical Activity:   . Days of Exercise per Week: Not on file  . Minutes of Exercise per Session: Not on file  Stress:   . Feeling of Stress : Not on file  Social Connections:   . Frequency of Communication with Friends and Family: Not on file  . Frequency of  Social Gatherings with Friends and Family: Not on file  . Attends Religious Services: Not on file  . Active Member of Clubs or Organizations: Not on file  . Attends Archivist Meetings: Not on file  . Marital Status: Not on file   No family history on file. Scheduled Meds: . divalproex  125 mg Oral BID  . fluticasone  1 spray Each Nare Daily  . heparin  5,000 Units Subcutaneous Q8H  .  hydrALAZINE  10 mg Oral Q8H  . methylPREDNISolone (SOLU-MEDROL) injection  40 mg Intravenous Q12H   Continuous Infusions: . dextrose    . morphine    . remdesivir 100 mg in NS 100 mL 100 mg (10/15/19 1000)   PRN Meds:.acetaminophen, albuterol, haloperidol lactate, LORazepam, morphine injection, morphine Allergies  Allergen Reactions  . Grass Pollen(K-O-R-T-Swt Vern)     unknown  . Sulfa Antibiotics     unknown    Vital Signs: BP 119/77   Pulse (!) 56   Temp (!) 95.2 F (35.1 C) (Rectal)   Resp (!) 22   Wt 53.1 kg   SpO2 93%   BMI 22.11 kg/m  Pain Scale: Faces   Pain Score: 4    SpO2: SpO2: 93 % O2 Device:SpO2: 93 % O2 Flow Rate: .O2 Flow Rate (L/min): 45 L/min  IO: Intake/output summary:   Intake/Output Summary (Last 24 hours) at 10/16/2019 0932 Last data filed at 10/15/2019 1817 Gross per 24 hour  Intake 291.31 ml  Output --  Net 291.31 ml    LBM:   Baseline Weight: Weight: 53.1 kg Most recent weight: Weight: 53.1 kg     Palliative Assessment/Data: 10%     Time In: 9:00 Time Out: 9:30 Time Total: 30 min. Visit consisted of counseling and education dealing with the complex and emotionally intense issues surrounding the need for palliative care and symptom management in the setting of serious and potentially life-threatening illness. Greater than 50%  of this time was spent counseling and coordinating care related to the above assessment and plan. The above conversation was completed via telephone due to the restrictions during the COVID-19 pandemic. Thorough  chart review and discussion with necessary members of the care team was completed as part of assessment. All issues were discussed and addressed but no physical exam was performed.  Signed by: Norvel Richards, PA-C Palliative Medicine Pager: [phone]  Please contact Palliative Medicine Team phone at [phone] for questions and concerns.  For individual provider: See Loretha Stapler

## 2019-10-16 NOTE — Progress Notes (Signed)
APP X. Blount made aware of patient rectal temp 95.2. Bear hugger ordered and put on patient. Will continue to monitor.

## 2019-10-16 NOTE — Progress Notes (Signed)
RT called to patient room to place patient on heated high flow nasal cannula.  Upon arrival patient was noted to be on salter high flow and non-rebreather mask with sats of 89% however patient did appear to be restless with increased work of breathing even when she took some time to settle.  Placed patient on 45L 100% FIO2 high flow with non-rebreather mask over top.  MD came to patient room to assess patient.  Sats currently 93%.  Will continue to monitor.

## 2019-10-16 NOTE — Progress Notes (Signed)
Patient is not able to take any PO meds this shift due to her altered mental status. She is holds crushed pills in her mouth without swallowing. PO hydralazine not given for this reason.

## 2019-10-16 NOTE — Progress Notes (Signed)
This note also relates to the following rows which could not be included: ECG Heart Rate - Cannot attach notes to unvalidated device data    10/16/19 0838  Vitals  Patient Position (if appropriate) Lying  Pulse Rate (!) 58  Resp (!) 38  Level of Consciousness  Level of Consciousness Responds to Voice  Oxygen Therapy  SpO2 93 %  O2 Device HFNC;Non-rebreather Mask  Heater temperature 89.4 F (31.9 C)  O2 Flow Rate (L/min) 45 L/min  FiO2 (%) 100 %  PCA/Epidural/Spinal Assessment  Respiratory Pattern Regular  Glasgow Coma Scale  Eye Opening 4  Best Verbal Response (NON-intubated) 1  Best Motor Response 5  Glasgow Coma Scale Score 10  MEWS Score  MEWS RR 3  MEWS Pulse 0  MEWS Systolic 0  MEWS LOC 1  MEWS Temp 1  MEWS Score 5  MEWS Score Color Red  MEWS Assessment  Is this an acute change? Yes  MEWS guidelines implemented *See Row Information* Red  MD notified of pt increased RR and spO2 needs. Respiratory called and heated high flow set up. New orders for morphine and ativan input. Will administer and continue to monitor.

## 2019-10-16 NOTE — Progress Notes (Signed)
PROGRESS NOTE    Jessica Hood  [id number]:[id number] DOB: 07-May-1928 DOA: 10/28/19 PCP: Cristina Gong, NP   Brief Narrative: 86 year old nursing home resident past medical history significant for advanced dementia presented with worsening shortness of breath fever, recently diagnosed with Covid.  Skilled nursing facility recently have been an outbreak.  Patient transferred to the emergency department due to development of new hypoxemia, increasing shortness of breath.  Evaluation in the ED chest x-ray showed multifocal pneumonia.   Assessment & Plan:   Active Problems:   COVID-19 virus infection   COVID-19  1-Acute Hypoxic Respiratory Failure secondary to COVID-19 pneumonia: Patient presented with hypoxemia, chest x-ray with airspace disease on the left lawn and on the basis of the right consistent with multifocal pneumonia. COVID-19 Labs  Recent Labs    2019-10-28 1045 10/15/19 1301 10/16/19 0345  DDIMER 1.45* 0.37 0.84*  FERRITIN 1,248* 882* 1,395*  LDH 466*  --   --   CRP 12.3* 5.9* 6.3*   Patient develops worsening hypoxemia, requiring NBM. Discussed with daughter risk and benefit of Actemra, anecdotal use.  She is ok with patient getting morphine for resp distress, she does not wants her mother to sufer. She was ok to anxiolytic if needed for agitation.  Palliative care consulted for goals of care.  Patient with increase oxygen requirement today. Appreciate palliative care team assistance. Agree with morphine Gtt for comfort.   No results found for: SARSCOV2NAA   AKI: Present with a BUN of 39, creatinine 1.1.  Patient prior creatinine per records 0.9. Replete k.  Renal function improved.   Transaminases: Likely related to viral illness.  Monitor.  Hypertension: hydralazine ordered.   Sinus bradycardia: asymptomatic.  Replete k.   Advanced dementia: On Depakote. Resume   Lactic acidosis: likely  related to hypoxemia and viral illness Improved. Pro-calcitonin less  than 0.16.    Estimated body mass index is 22.11 kg/m as calculated from the following:   Height as of 08/21/19: 5\' 1"  (1.549 m).   Weight as of this encounter: 53.1 kg.   DVT prophylaxis: Heparin  Code Status: DNR Family Communication: Daughter over phone 1-4 Disposition Plan: expect Hospital Death.  Consultants:   None  Procedures:     Antimicrobials:    Subjective: Lethargic, agitated at times  Objective: Vitals:   10/16/19 0400 10/16/19 0600 10/16/19 0606 10/16/19 0838  BP: (!) 148/60  137/71   Pulse:    (!) 58  Resp: 15 (!) 23  (!) 38  Temp:  (!) 95.2 F (35.1 C)    TempSrc:  Rectal    SpO2: 95% 95%  93%  Weight:        Intake/Output Summary (Last 24 hours) at 10/16/2019 0847 Last data filed at 10/15/2019 1817 Gross per 24 hour  Intake 291.31 ml  Output --  Net 291.31 ml   Filed Weights   10/15/19 1023  Weight: 53.1 kg    Examination:  General exam: thin , lethargic Respiratory system: Tachypnea, Cardiovascular system: S 1, S 2 RRR Gastrointestinal system: BS present, soft, nt Central nervous system: lethargic Extremities: no edema Skin: no rashes    Data Reviewed: I have personally reviewed following labs and imaging studies  CBC: Recent Labs  Lab 28-Oct-2019 1045 10/15/19 1301 10/16/19 0345  WBC 8.7 9.9 12.7*  NEUTROABS 7.3 8.4* 11.0*  HGB 12.1 10.1* 12.6  HCT 37.8 31.4* 39.0  MCV 99.7 98.1 97.5  PLT 233 211 280   Basic Metabolic Panel: Recent Labs  Lab  10/31/2019 1045 10/15/19 1301 10/16/19 0345  NA 144 143 146*  K 3.7 2.7* 4.5  CL 105 109 108  CO2 29 22 25   GLUCOSE 116* 121* 148*  BUN 39* 43* 53*  CREATININE 1.19* 0.69 0.73  CALCIUM 8.9 7.1* 8.9  MG  --  2.0 2.8*  PHOS  --  4.6 3.0   GFR: Estimated Creatinine Clearance: 36 mL/min (by C-G formula based on SCr of 0.73 mg/dL). Liver Function Tests: Recent Labs  Lab 11/08/2019 1045 10/15/19 1301 10/16/19 0345  AST 64* 34 37  ALT 32 23 25  ALKPHOS 48 33* 43  BILITOT  1.2 1.4* 0.8  PROT 7.2 5.7* 6.9  ALBUMIN 3.3* 2.8* 3.3*   No results for input(s): LIPASE, AMYLASE in the last 168 hours. No results for input(s): AMMONIA in the last 168 hours. Coagulation Profile: No results for input(s): INR, PROTIME in the last 168 hours. Cardiac Enzymes: No results for input(s): CKTOTAL, CKMB, CKMBINDEX, TROPONINI in the last 168 hours. BNP (last 3 results) No results for input(s): PROBNP in the last 8760 hours. HbA1C: No results for input(s): HGBA1C in the last 72 hours. CBG: No results for input(s): GLUCAP in the last 168 hours. Lipid Profile: Recent Labs    10/23/2019 1045  TRIG 136   Thyroid Function Tests: No results for input(s): TSH, T4TOTAL, FREET4, T3FREE, THYROIDAB in the last 72 hours. Anemia Panel: Recent Labs    10/15/19 1301 10/16/19 0345  FERRITIN 882* 1,395*   Sepsis Labs: Recent Labs  Lab 11/10/2019 1045 11/07/2019 1435  PROCALCITON 0.16  --   LATICACIDVEN 2.7* 1.3    Recent Results (from the past 240 hour(s))  Blood Culture (routine x 2)     Status: None (Preliminary result)   Collection Time: 10/23/2019 11:08 AM   Specimen: BLOOD  Result Value Ref Range Status   Specimen Description BLOOD LEFT ANTECUBITAL  Final   Special Requests   Final    BOTTLES DRAWN AEROBIC AND ANAEROBIC Blood Culture adequate volume   Culture   Final    NO GROWTH < 24 HOURS Performed at [facility], [address], [place], [place]    Report Status PENDING  Incomplete         Radiology Studies: DG CHEST PORT 1 VIEW  Result Date: 10/15/2019 CLINICAL DATA:  Shortness of breath, fever. EXAM: PORTABLE CHEST 1 VIEW COMPARISON:  October 14, 2019. FINDINGS: Stable cardiomediastinal silhouette. No pneumothorax or pleural effusion is noted. Stable bilateral lung opacities are noted, left greater than right, consistent with multifocal pneumonia. Bony thorax is unremarkable. IMPRESSION: Stable bilateral lung opacities are noted, left greater  than right, consistent with multifocal pneumonia. Followup radiographs are recommended until resolution. Electronically Signed   By: October 16, 2019 M.D.   On: 10/15/2019 09:44   DG Chest Port 1 View  Result Date: 11/09/2019 CLINICAL DATA:  Shortness of breath. Additional history provided: Fever, shortness of breath and body aches, unknown COVID status. EXAM: PORTABLE CHEST 1 VIEW COMPARISON:  No pertinent prior studies available for comparison. FINDINGS: Heart size at the upper limits of normal. Patchy airspace opacity throughout the left lung and at the right lung base. No evidence of pleural effusion or pneumothorax. No acute bony abnormality. Overlying cardiac monitoring leads. IMPRESSION: Patchy airspace opacity throughout the left lung and within the right lung base, likely reflecting multifocal pneumonia given provided history. Radiographic follow-up to resolution recommended. Electronically Signed   By: 12/12/2019 DO  On: 10/31/2019 10:50   ECHOCARDIOGRAM LIMITED  Result Date: 10/25/2019   ECHOCARDIOGRAM LIMITED REPORT   Patient Name:   MALAYA CAGLEY [facility] Date of Exam: 10/25/2019 Medical Rec #:  [id number]        Height:       61.0 in Accession #:    [id number]       Weight:       112.4 lb Date of Birth:  [birth year]        BSA:          1.48 m Patient Age:    29 years         BP:           142/70 mmHg Patient Gender: F                HR:           52 bpm. Exam Location:  Inpatient  Procedure: Limited Echo, Limited Color Doppler and Cardiac Doppler Indications:    dyspnea 786.09  History:        Patient has no prior history of Echocardiogram examinations.  Sonographer:    Johny Chess Referring Phys: [id number] [place]  1. Left ventricular ejection fraction, by visual estimation, is 60 to 65%. The left ventricle has normal function. There is no increased left ventricular wall thickness.  2. The left ventricle has no regional wall motion abnormalities.  3. Global right ventricle has  normal systolc function.The right ventricular size is normal. no increase in right ventricular wall thickness.  4. Presence of pericardial fat pad.  5. The mitral valve is grossly normal. Trivial mitral valve regurgitation.  6. The tricuspid valve was grossly normal. Tricuspid valve regurgitation is mild.  7. Tricuspid valve regurgitation is mild.  8. No evidence of aortic valve sclerosis or stenosis.  9. TR signal is inadequate for assessing pulmonary artery systolic pressure. 10. The inferior vena cava is normal in size with greater than 50% respiratory variability, suggesting right atrial pressure of 3 mmHg. 11. No prior Echocardiogram. FINDINGS  Left Ventricle: Left ventricular ejection fraction, by visual estimation, is 60 to 65%. The left ventricle has normal function. The left ventricle has no regional wall motion abnormalities. The left ventricular internal cavity size was the left ventricle is normal in size. There is no increased left ventricular wall thickness. Left ventricular diastolic parameters were normal. Normal left atrial pressure. Right Ventricle: The right ventricular size is normal. No increase in right ventricular wall thickness. Global RV systolic function is has normal systolic function. Left Atrium: Left atrial size was normal in size. Right Atrium: Right atrial size was normal in size. Right atrial pressure is estimated at 3 mmHg. Pericardium: There is no evidence of pericardial effusion is seen. There is no evidence of pericardial effusion. Presence of pericardial fat pad. Mitral Valve: The mitral valve is grossly normal. MV Area by PHT, 3.20 cm. MV PHT, 68.73 msec. Trivial mitral valve regurgitation. Tricuspid Valve: The tricuspid valve is grossly normal. Tricuspid valve regurgitation is mild. Aortic Valve: The aortic valve is tricuspid. Aortic valve regurgitation is not visualized. The aortic valve is structurally normal, with no evidence of sclerosis or stenosis. Pulmonic Valve: The  pulmonic valve was grossly normal. Pulmonic valve regurgitation is not visualized by color flow Doppler. Pulmonic regurgitation is not visualized by color flow Doppler. Aorta: The aortic root and ascending aorta are structurally normal, with no evidence of dilitation. Venous: The inferior vena cava is normal in size with  greater than 50% respiratory variability, suggesting right atrial pressure of 3 mmHg. Shunts: No atrial level shunt detected by color flow Doppler.  LEFT VENTRICLE          Normals PLAX 2D LVIDd:         4.80 cm  3.6 cm   Diastology                 Normals LVIDs:         2.80 cm  1.7 cm   LV e' lateral:   9.68 cm/s 6.42 cm/s LV PW:         0.80 cm  1.4 cm   LV E/e' lateral: 8.3       15.4 LV IVS:        0.90 cm  1.3 cm   LV e' medial:    7.07 cm/s 6.96 cm/s LVOT diam:     1.90 cm  2.0 cm   LV E/e' medial:  11.3      6.96 LV SV:         78 ml    79 ml LV SV Index:   52.54    45 ml/m2 LVOT Area:     2.84 cm 3.14 cm2  LEFT ATRIUM         Index LA diam:    3.60 cm 2.43 cm/m  AORTIC VALVE             Normals LVOT Vmax:   94.00 cm/s LVOT Vmean:  53.300 cm/s 75 cm/s LVOT VTI:    0.238 m     25.3 cm  AORTA                 Normals Ao Root diam: 2.80 cm 31 mm Ao Asc diam:  3.30 cm 31 mm MITRAL VALVE              Normals MV Area (PHT): 3.20 cm             SHUNTS MV PHT:        68.73 msec 55 ms     Systemic VTI:  0.24 m MV Decel Time: 237 msec   187 ms    Systemic Diam: 1.90 cm MV E velocity: 80.10 cm/s 103 cm/s MV A velocity: 78.40 cm/s 70.3 cm/s MV E/A ratio:  1.02       1.5  Lennie Odor MD Electronically signed by Lennie Odor MD Signature Date/Time: 2019-11-05/6:47:40 PMThe mitral valve is grossly normal.    Final         Scheduled Meds: . divalproex  125 mg Oral BID  . fluticasone  1 spray Each Nare Daily  . heparin  5,000 Units Subcutaneous Q8H  . hydrALAZINE  10 mg Oral Q8H  . methylPREDNISolone (SOLU-MEDROL) injection  40 mg Intravenous Q12H   Continuous Infusions: . dextrose    .  remdesivir 100 mg in NS 100 mL 100 mg (10/15/19 1000)     LOS: 2 days    Time spent: 35 minutes.     Alba Cory, MD [facility]   If 7PM-7AM, please contact night-coverage [email or web address] Password [facility] 10/16/2019, 8:47 AM

## 2019-10-16 NOTE — Progress Notes (Signed)
PT Cancellation Note  Patient Details Name: Jessica Hood MRN: [id number] DOB: Dec 17, 1927   Cancelled Treatment:    Reason Eval/Treat Not Completed: Medical issues which prohibited therapy. Pt unable to tolerate PT intervention. Pt on 45L O2 via HFNC and NRB. Palliative Care has been consulted for goals of care. Morphine and Ativan have been ordered for comfort. Family visiting today to determine if they desire full comfort care. PT signing off.  Ilda Foil 10/16/2019, 12:02 PM  Aida Raider, PT  Office # [phone] Pager [phone]

## 2019-10-16 NOTE — Progress Notes (Signed)
Palliative follow up.  Chart reviewed.  Discussed with bedside RN.  Discussed with [facility] Attending.    Patient is on maximum amount of oxygen 45L with a non re-breather mask and she is still very air hungry.    Her daughter Edmon Crape came to see her this morning.  Edmon Crape states that her mother was not responsive to her.  When [place] spoke her mother would become agitated and thrash around.  Edmon Crape spoke with her two brothers.  Together the three of them agree that her mother would not want to live like this.  Janna asked to shift our goal of treatment to comfort, but also to continue the medicines to treat COVID for just 1 more day.  I discussed this with Dr. Sunnie Nielsen.  She supports continuing the COVID medications one more day if it will give the daughter and family some peace.  There is great concern that the patient will pass away in hours.  PMT will touch base with RN and family tomorrow for next steps.  Recommendations:  1.  Goal of care has shifted to comfort.  Therefore morphine gtt has been started and will be titrated for symptom relief.  In addition scheduled low dose anti-anxiety medication has been initiated.  2.  COVID treatment including steroids, antivirals, and heparin will be left in place until a re-assessment can be made tomorrow.   If she has not improved PMT will talk with [place] and discontinue COVID medications.  Prognosis:  Hours to days   Norvel Richards, PA-C Palliative Medicine Office:  [phone]

## 2019-10-17 DIAGNOSIS — J9601 Acute respiratory failure with hypoxia: Secondary | ICD-10-CM

## 2019-10-17 DIAGNOSIS — R0609 Other forms of dyspnea: Secondary | ICD-10-CM

## 2019-10-17 DIAGNOSIS — Z515 Encounter for palliative care: Secondary | ICD-10-CM

## 2019-10-17 DIAGNOSIS — Z66 Do not resuscitate: Secondary | ICD-10-CM

## 2019-10-17 LAB — BASIC METABOLIC PANEL
Anion gap: 15 (ref 5–15)
BUN: 75 mg/dL — ABNORMAL HIGH (ref 8–23)
CO2: 24 mmol/L (ref 22–32)
Calcium: 8.7 mg/dL — ABNORMAL LOW (ref 8.9–10.3)
Chloride: 106 mmol/L (ref 98–111)
Creatinine, Ser: 1.84 mg/dL — ABNORMAL HIGH (ref 0.44–1.00)
GFR calc Af Amer: 28 mL/min — ABNORMAL LOW (ref >60)
GFR calc non Af Amer: 24 mL/min — ABNORMAL LOW (ref >60)
Glucose, Bld: 219 mg/dL — ABNORMAL HIGH (ref 70–99)
Potassium: 4.7 mmol/L (ref 3.5–5.1)
Sodium: 145 mmol/L (ref 135–145)

## 2019-10-18 DIAGNOSIS — J9601 Acute respiratory failure with hypoxia: Secondary | ICD-10-CM

## 2019-10-18 DIAGNOSIS — Z66 Do not resuscitate: Secondary | ICD-10-CM

## 2019-10-18 DIAGNOSIS — R0600 Dyspnea, unspecified: Secondary | ICD-10-CM

## 2019-10-19 LAB — CULTURE, BLOOD (ROUTINE X 2)
Culture: NO GROWTH
Special Requests: ADEQUATE

## 2019-11-13 NOTE — Progress Notes (Signed)
PROGRESS NOTE    Jessica Hood  WUJ:[id number] DOB: [birth year] DOA: 10/31/2019 PCP: Maxwell Caul, NP   Brief Narrative: 86 year old nursing home resident past medical history significant for advanced dementia presented with worsening shortness of breath fever, recently diagnosed with Covid.  Skilled nursing facility recently have been an outbreak.  Patient transferred to the emergency department due to development of new hypoxemia, increasing shortness of breath.  Evaluation in the ED chest x-ray showed multifocal pneumonia.  Patient was admitted with acute hypoxic respiratory failure secondary to COVID-19 pneumonia she was treated aggressively with IV dexamethasone, remdesivir.  She developed worsening hypoxemia requiring high flow oxygen and subsequently he require high flow oxygen.  She received a dose of Actemra.  Patient continued to decline.  Palliative care consulted.  After communication with family, patient was transitioned to comfort care.   Assessment & Plan:   Active Problems:   COVID-19 virus infection   COVID-19   Sepsis ([place])   Palliative care encounter  1-Acute Hypoxic Respiratory Failure secondary to COVID-19 pneumonia: Patient presented with hypoxemia, chest x-ray with airspace disease on the left lawn and on the basis of the right consistent with multifocal pneumonia. COVID-19 Labs  Recent Labs    10/15/19 1301 10/16/19 0345  DDIMER 0.37 0.84*  FERRITIN 882* 1,395*  CRP 5.9* 6.3*   Patient develops worsening hypoxemia, requiring NBM. Discussed with daughter risk and benefit of Actemra, anecdotal use. Patient received one dose of Actemra.  Palliative care consulted for goals of care.  Patient with increase oxygen requirement. Appreciate palliative care team assistance. Agree with morphine Gtt for comfort.  Patient continue to be hypoxic. Goal is for comfort care.   No results found for: SARSCOV2NAA   AKI: Present with a BUN of 39, creatinine 1.1.  Patient  prior creatinine per records 0.9. Replete k.  Renal function improved.  Comfort care.   Transaminases: Likely related to viral illness.  Monitor.  Hypertension: hydralazine ordered.   Sinus bradycardia: asymptomatic.  Replete k.   Advanced dementia: On Depakote. Not tolerating oral.   Lactic acidosis: likely  related to hypoxemia and viral illness Pro-calcitonin less than 0.16.    Estimated body mass index is 22.11 kg/m as calculated from the following:   Height as of 08/21/19: 5\' 1"  (1.549 m).   Weight as of this encounter: 53.1 kg.   DVT prophylaxis: Heparin  Code Status: DNR Family Communication: Daughter over phone 1-4 Disposition Plan: expect Hospital Death.  Consultants:   None  Procedures:     Antimicrobials:    Subjective: Lethargic, labor breathing. Hypoxic.   Objective: Vitals:   Oct 24, 2019 0449 10/24/2019 0450 10-24-2019 0500 10-24-19 0908  BP:  (!) 104/55    Pulse:   86 (!) 122  Resp:   19 (!) 28  Temp: 99 F (37.2 C)     TempSrc:      SpO2:   (!) 86% (!) 80%  Weight:        Intake/Output Summary (Last 24 hours) at 2019/10/24 1322 Last data filed at 10/16/2019 1818 Gross per 24 hour  Intake 378.84 ml  Output --  Net 378.84 ml   Filed Weights   10/15/19 1023  Weight: 53.1 kg    Examination:  General exam: Lethargic Respiratory system: Tachypnea, decreased breath sounds.  Cardiovascular system: S 1, S 2, RRR Gastrointestinal system: BS present, soft, nt Central nervous system: Lethargic  Extremities: No edema Skin: No rashes    Data Reviewed: I have personally reviewed following  labs and imaging studies  CBC: Recent Labs  Lab 10/26/2019 1045 10/15/19 1301 10/16/19 0345  WBC 8.7 9.9 12.7*  NEUTROABS 7.3 8.4* 11.0*  HGB 12.1 10.1* 12.6  HCT 37.8 31.4* 39.0  MCV 99.7 98.1 97.5  PLT 233 211 280   Basic Metabolic Panel: Recent Labs  Lab 10/19/2019 1045 10/15/19 1301 10/16/19 0345 11-04-2019 0821  NA 144 143 146* 145  K 3.7  2.7* 4.5 4.7  CL 105 109 108 106  CO2 29 22 25 24   GLUCOSE 116* 121* 148* 219*  BUN 39* 43* 53* 75*  CREATININE 1.19* 0.69 0.73 1.84*  CALCIUM 8.9 7.1* 8.9 8.7*  MG  --  2.0 2.8*  --   PHOS  --  4.6 3.0  --    GFR: Estimated Creatinine Clearance: 15.6 mL/min (A) (by C-G formula based on SCr of 1.84 mg/dL (H)). Liver Function Tests: Recent Labs  Lab 11/01/2019 1045 10/15/19 1301 10/16/19 0345  AST 64* 34 37  ALT 32 23 25  ALKPHOS 48 33* 43  BILITOT 1.2 1.4* 0.8  PROT 7.2 5.7* 6.9  ALBUMIN 3.3* 2.8* 3.3*   No results for input(s): LIPASE, AMYLASE in the last 168 hours. No results for input(s): AMMONIA in the last 168 hours. Coagulation Profile: No results for input(s): INR, PROTIME in the last 168 hours. Cardiac Enzymes: No results for input(s): CKTOTAL, CKMB, CKMBINDEX, TROPONINI in the last 168 hours. BNP (last 3 results) No results for input(s): PROBNP in the last 8760 hours. HbA1C: No results for input(s): HGBA1C in the last 72 hours. CBG: No results for input(s): GLUCAP in the last 168 hours. Lipid Profile: No results for input(s): CHOL, HDL, LDLCALC, TRIG, CHOLHDL, LDLDIRECT in the last 72 hours. Thyroid Function Tests: No results for input(s): TSH, T4TOTAL, FREET4, T3FREE, THYROIDAB in the last 72 hours. Anemia Panel: Recent Labs    10/15/19 1301 10/16/19 0345  FERRITIN 882* 1,395*   Sepsis Labs: Recent Labs  Lab 10/25/2019 1045 10/26/2019 1435  PROCALCITON 0.16  --   LATICACIDVEN 2.7* 1.3    Recent Results (from the past 240 hour(s))  Blood Culture (routine x 2)     Status: None (Preliminary result)   Collection Time: 10/29/2019 11:08 AM   Specimen: BLOOD  Result Value Ref Range Status   Specimen Description BLOOD LEFT ANTECUBITAL  Final   Special Requests   Final    BOTTLES DRAWN AEROBIC AND ANAEROBIC Blood Culture adequate volume   Culture   Final    NO GROWTH 3 DAYS Performed at [facility], [address], [place], [place]     Report Status PENDING  Incomplete         Radiology Studies: No results found.      Scheduled Meds: . divalproex  125 mg Oral BID  . LORazepam  0.5 mg Intravenous Q8H   Continuous Infusions: . morphine 3 mg/hr (2019-11-04 1050)     LOS: 3 days    Time spent: 35 minutes.     12/15/19, MD [facility]   If 7PM-7AM, please contact night-coverage [email or web address] Password [email or web address] November 04, 2019, 1:22 PM

## 2019-11-13 NOTE — Progress Notes (Signed)
Received tele call about pt's low HR in 30s. Went into pts room as HR zeroed. Verified passing of pt w/ second RN. MD notified. Daughter Edmon Crape called about pts passing.

## 2019-11-13 NOTE — Progress Notes (Signed)
Nutrition Brief Note  RD drawn to pt due to Low Braden score. Chart reviewed. Pt now transitioning to comfort care.  No nutrition interventions warranted at this time.  Please consult as needed.    Earma Reading, MS, RD, LDN Inpatient Clinical Dietitian Pager: [phone] Weekend/After Hours: [phone]

## 2019-11-13 NOTE — Death Summary Note (Addendum)
Death Summary  Jessica Hood [id number] DOB: April 02, 1928 DOA: 11-11-2019  PCP: Cristina Gong, NP PCP/Office notified:  Admit date: 11/11/2019 Date of Death: 11-14-2019  Final Diagnoses:  Active Problems:   COVID-19 virus infection   COVID-19   Sepsis ([facility])   Palliative care encounter     History of present illness:  86 year old nursing home resident past medical history significant for advanced dementia presented with worsening shortness of breath fever, recently diagnosed with Covid.  Skilled nursing facility recently have been an outbreak.  Patient transferred to the emergency department due to development of new hypoxemia, increasing shortness of breath.  Evaluation in the ED chest x-ray showed multifocal pneumonia.  Patient was admitted with acute hypoxic respiratory failure secondary to COVID-19 pneumonia she was treated aggressively with IV dexamethasone, remdesivir.  She developed worsening hypoxemia requiring high flow oxygen and subsequently he require high flow oxygen.  She received a dose of Actemra.  Patient continued to decline.  Palliative care consulted.  After communication with family, patient was transitioned to comfort care.  Hospital Course:   1-Acute Hypoxic Respiratory Failure secondary to COVID-19 pneumonia: Patient presented with hypoxemia, chest x-ray with airspace disease on the left lawn and on the basis of the right consistent with multifocal pneumonia. COVID-19 Labs  Recent Labs (last 2 labs)       Recent Labs    10/15/19 1301 10/16/19 0345  DDIMER 0.37 0.84*  FERRITIN 882* 1,395*  CRP 5.9* 6.3*     Patient develops worsening hypoxemia, requiring NBM. Discussed with daughter risk and benefit of Actemra, anecdotal use. Patient received one dose of Actemra.  Palliative care consulted for goals of care.  Patient with increase oxygen requirement. Appreciate palliative care team assistance. Agree with morphine Gtt for comfort.  Patient continue to  be hypoxic. Goal is for comfort care.  Patient was started on Morphine gtt. Her symptoms was manage. Patient subsequently died 11-14-19 at 13;38  AKI: Present with a BUN of 39, creatinine 1.1.  Patient prior creatinine per records 0.9. Replete k.  Renal function improved.  Comfort care.   Transaminases: Likely related to viral illness.  Monitor.  Hypertension: hydralazine ordered.   Sinus bradycardia: asymptomatic.  Replete k.   Advanced dementia: On Depakote. Not tolerating oral.   Lactic acidosis: likely  related to hypoxemia and viral illness Pro-calcitonin less than 0.16.    Time: 13;28  Signed:  Lalania Haseman A Bana Borgmeyer  [facility] 2019-11-14, 2:05 PM

## 2019-11-13 NOTE — Progress Notes (Signed)
Patient ID: Jessica Hood, female   DOB: [birth year], 86 y.o.   MRN: [id number]  This NP  followed up for palliative medicine needs and emotional support for patient Jessica Hood.  Spoke to daughter/ Ms Houseman by phone for continued conversation regarding current medical situation.   She verbalizes an understanding of the seriousness of the current medical situation for her mother and as a family they have made decision to shift to a full comfort path.  We discussed the natural trajectory and expectations at end of life.  Patient's prognosis is likely hours to days.  Plan of care -DNR/DNI -No artificial feeding or hydration now or in the future -No further diagnostics or life prolonging measures, allow a natural death -Symptom management     -Morphine for dyspnea, family request to continue oxygen     -Ativan for agitation  We will continue to monitor patient and if indicated will continue discussion regarding transitions of care.  Patient is from [facility], patient is Covid positive.  I shared with family that [facility] is the only residential hospice that is taking Covid patients at this time, however I prepared them for a hospital death expectation.  Questions and concerns addressed   Discussed with Dr Sunnie Nielsen and bedside RN  Total time spent on the unit was 35 minutes  Greater than 50% of the time was spent in counseling and coordination of care  Lorinda Creed NP  Palliative Medicine Team Team Phone # [phone] Pager [phone]

## 2019-11-13 DEATH — deceased

## 2020-01-30 ENCOUNTER — Ambulatory Visit: Payer: Medicare Other | Admitting: Neurology

## 2020-06-09 IMAGING — CT CT CERVICAL SPINE W/O CM
3 of 4 series · 12 of 33 positions shown, 14 images · non-contrast
Comparison: Head CT dated 06/17/2019

CLINICAL DATA: 89-year-old female with head trauma.

EXAM:
CT HEAD WITHOUT CONTRAST
CT MAXILLOFACIAL WITHOUT CONTRAST
CT CERVICAL SPINE WITHOUT CONTRAST
TECHNIQUE: Multidetector CT imaging of the head, cervical spine, and
maxillofacial structures were performed using the standard protocol
without intravenous contrast. Multiplanar CT image reconstructions
of the cervical spine and maxillofacial structures were also
generated.

[Series 5: orthogonal axials · axial · 0.23mm/px · z∈[-294,-171]mm · 4 of 96 slices shown, 5 images]
[im 16/96  soft-tissue]
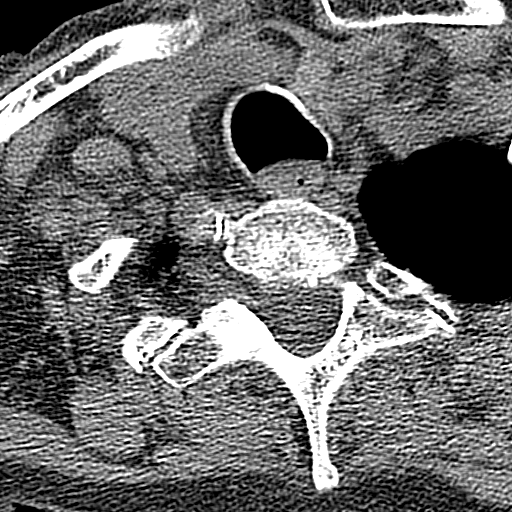
[im 16/96  bone]
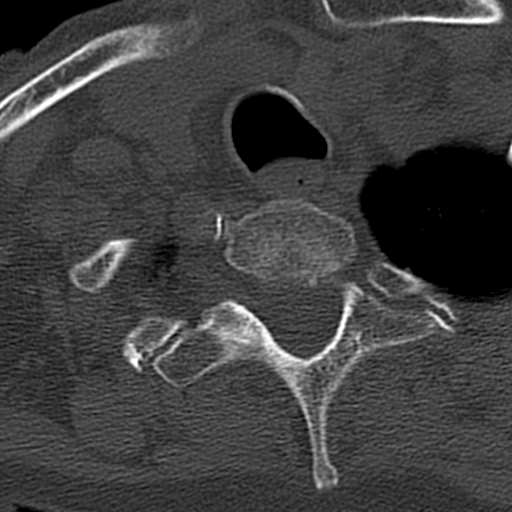
[im 32/96  bone]
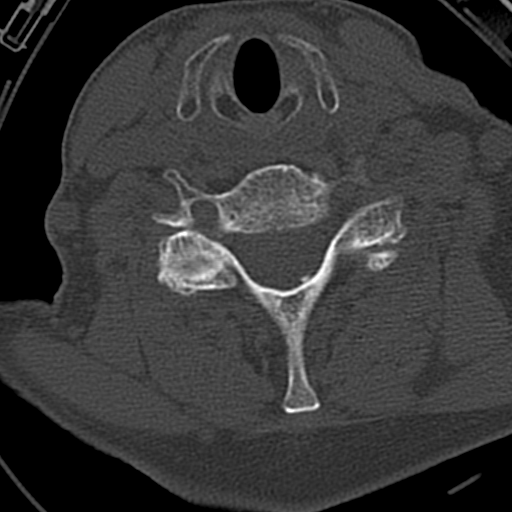
[im 64/96  bone]
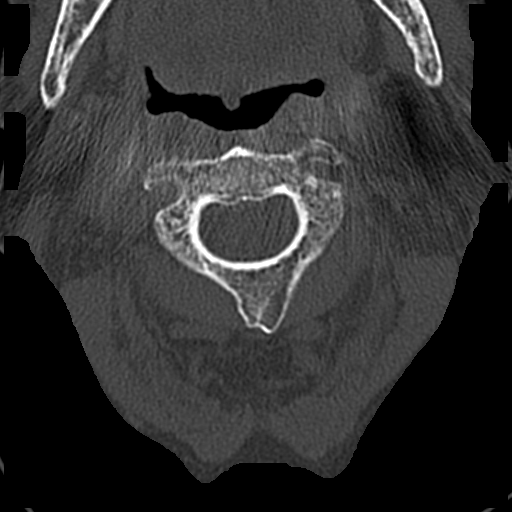
[im 80/96  bone]
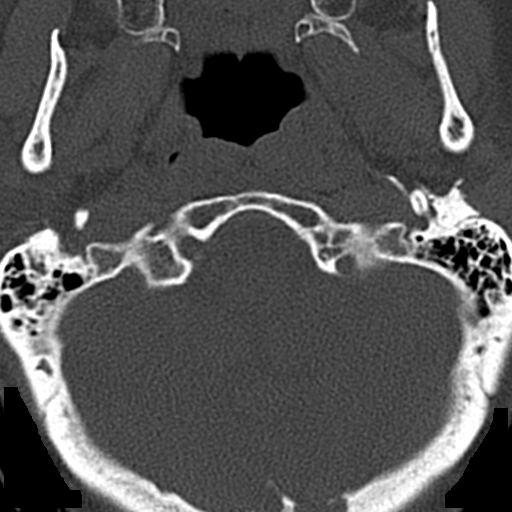

[Series 6: coronal bone · coronal · 0.26mm/px · 3 of 61 slices shown]
[im 13/61  bone]
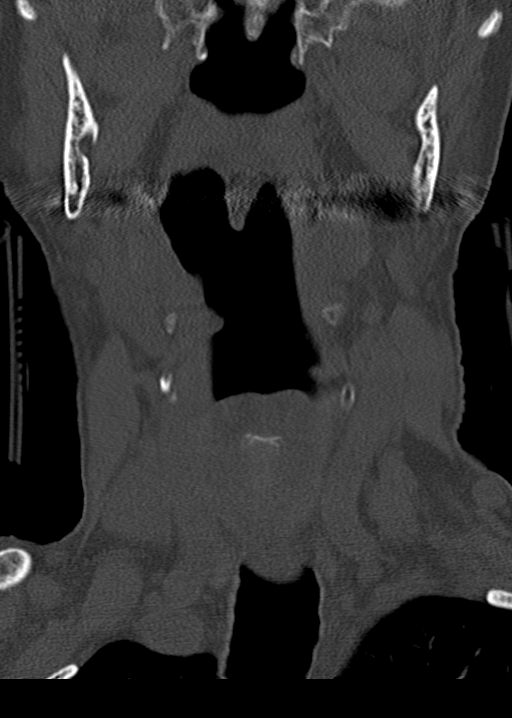
[im 25/61  bone]
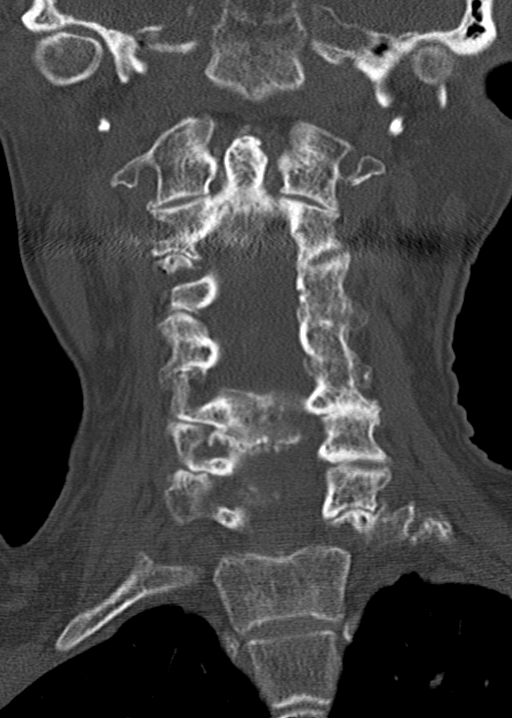
[im 37/61  bone]
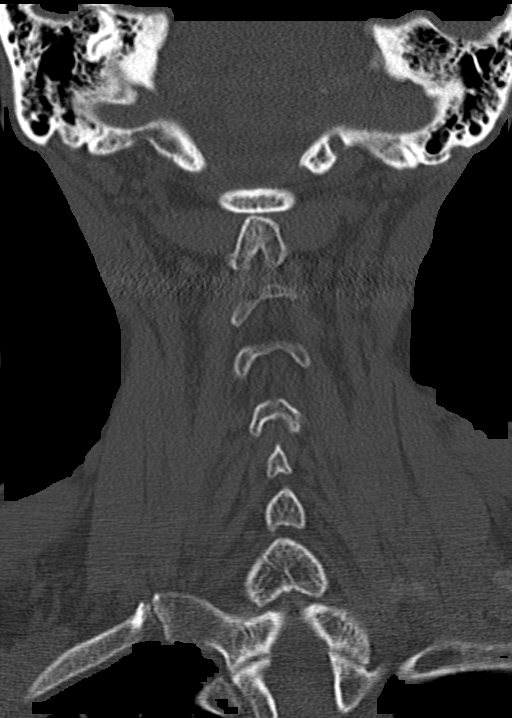

[Series 7: sagittal bone · sagittal · 0.27mm/px · 5 of 61 slices shown, 6 images]
[im 21/61  bone]
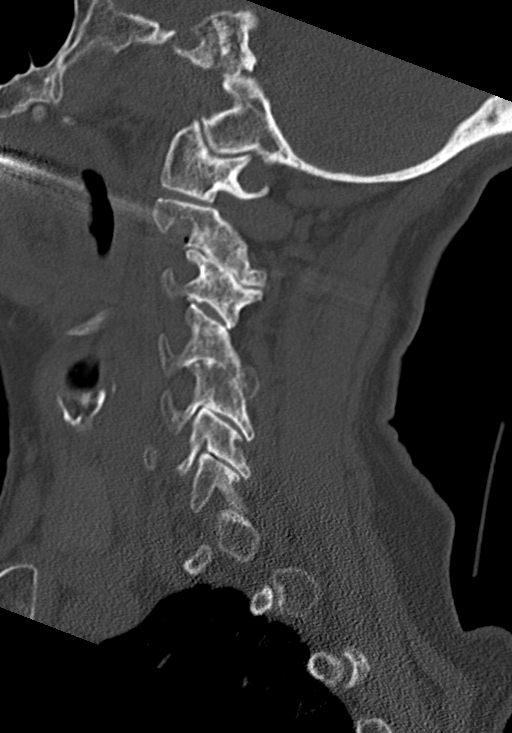
[im 26/61  bone]
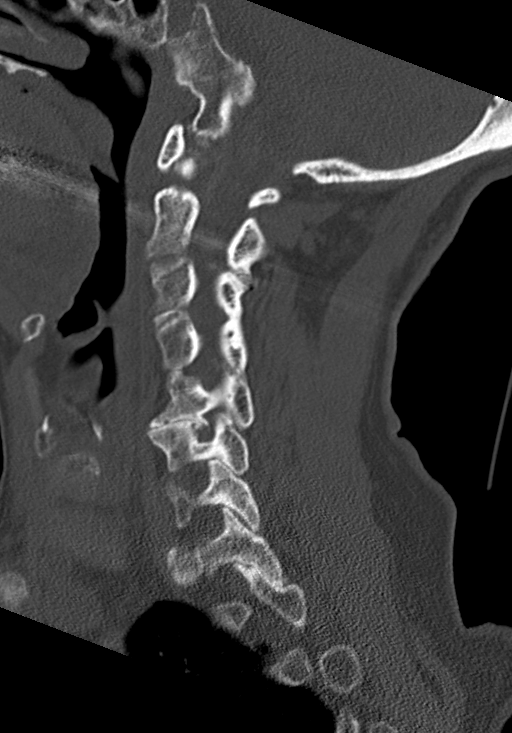
[im 31/61  soft-tissue]
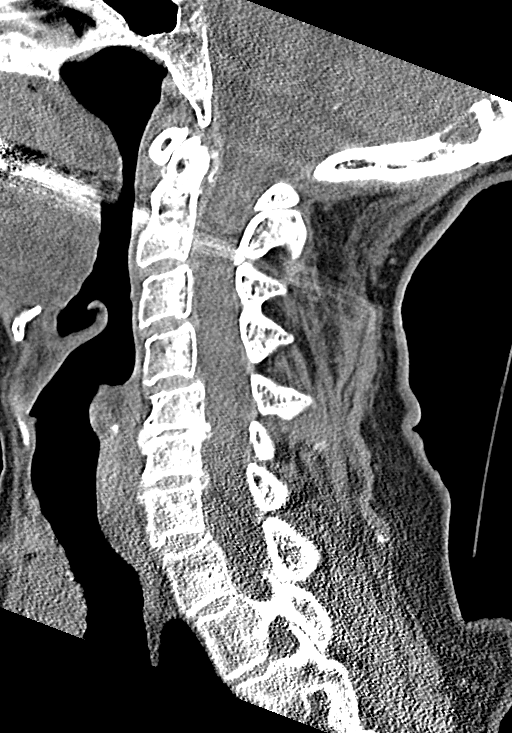
[im 31/61  bone]
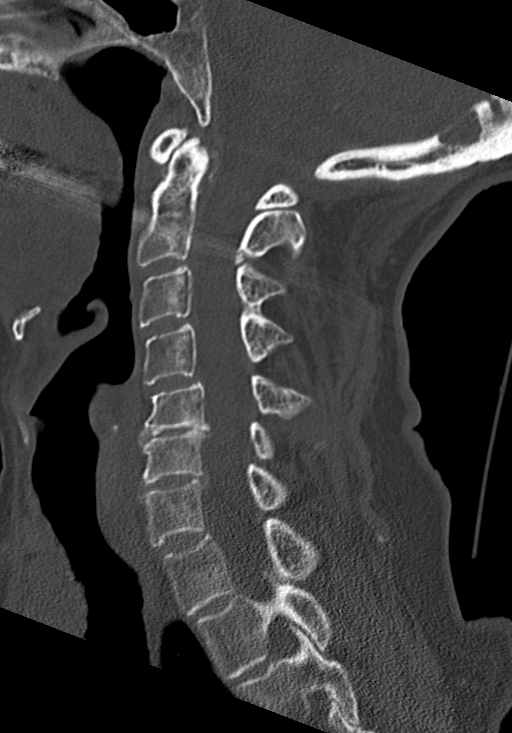
[im 36/61  bone]
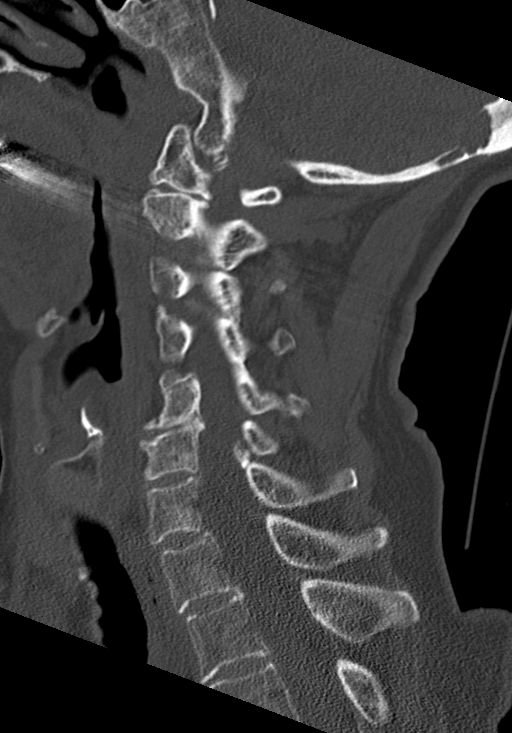
[im 41/61  bone]
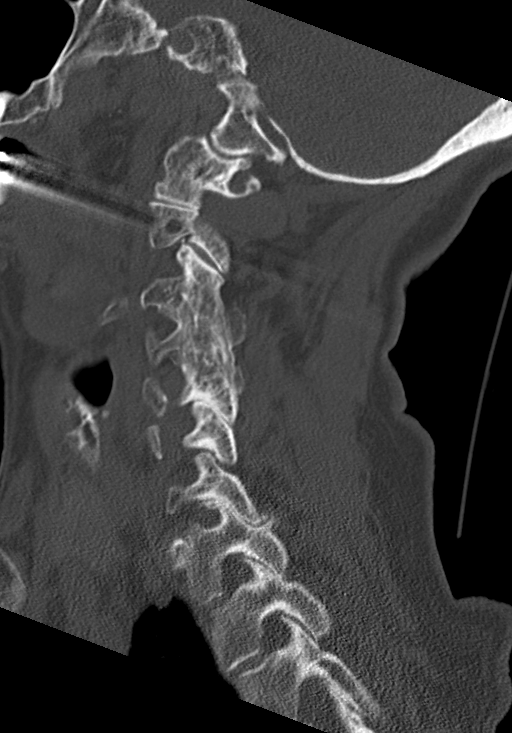

[12 of 33 positions shown; findings below may reference images not displayed]

FINDINGS: CT HEAD FINDINGS

Brain: Moderate age-related atrophy and chronic microvascular
ischemic changes. Trace linear high attenuating density noted in the
right parietal sulcation (coronal series 5, image 48 and sagittal
series 6, image 24) likely trace acute subarachnoid hemorrhage. This
is in similar location as the prior CT of 06/17/2019. No other acute
intracranial hemorrhage identified. No mass effect or midline shift.
No extra-axial fluid collection.

Vascular: No hyperdense vessel or unexpected calcification.

Skull: Normal. Negative for fracture or focal lesion.

Other: None.

CT MAXILLOFACIAL FINDINGS

Osseous: No fracture or mandibular dislocation. No destructive
process.

Orbits: Negative. No traumatic or inflammatory finding.

Sinuses: Clear.

Soft tissues: None

CT CERVICAL SPINE FINDINGS

Alignment: No acute subluxation. Grade 1 C4-C5 anterolisthesis.

Skull base and vertebrae: No acute fracture.

Soft tissues and spinal canal: No prevertebral fluid or swelling. No
visible canal hematoma.

Disc levels: Multilevel degenerative changes most prominent at
C5-C6. Multilevel facet arthropathy.

Upper chest: Negative.

Other: None
IMPRESSION: 1. Trace right parietal subarachnoid hemorrhage.
2. No acute/traumatic cervical spine pathology.
3. No facial bone fractures.

These results were called by telephone at the time of interpretation
on 06/29/2019 at [DATE] to provider GARICITO ARIMA , who verbally
acknowledged these results.
